# Patient Record
Sex: Female | Born: 1935 | Race: Black or African American | Hispanic: No | Smoking: Never smoker
Health system: Southern US, Community
[De-identification: ages and names within clinical notes are randomized; demographics above are authoritative.]

## PROBLEM LIST (undated history)

## (undated) DIAGNOSIS — M722 Plantar fascial fibromatosis: Secondary | ICD-10-CM

## (undated) DIAGNOSIS — I10 Essential (primary) hypertension: Secondary | ICD-10-CM

## (undated) DIAGNOSIS — Z789 Other specified health status: Secondary | ICD-10-CM

## (undated) DIAGNOSIS — E7800 Pure hypercholesterolemia, unspecified: Secondary | ICD-10-CM

## (undated) DIAGNOSIS — E119 Type 2 diabetes mellitus without complications: Secondary | ICD-10-CM

## (undated) DIAGNOSIS — E785 Hyperlipidemia, unspecified: Secondary | ICD-10-CM

## (undated) DIAGNOSIS — M109 Gout, unspecified: Secondary | ICD-10-CM

## (undated) DIAGNOSIS — E559 Vitamin D deficiency, unspecified: Secondary | ICD-10-CM

## (undated) DIAGNOSIS — J309 Allergic rhinitis, unspecified: Secondary | ICD-10-CM

## (undated) DIAGNOSIS — R011 Cardiac murmur, unspecified: Secondary | ICD-10-CM

## (undated) DIAGNOSIS — N189 Chronic kidney disease, unspecified: Secondary | ICD-10-CM

## (undated) DIAGNOSIS — M1990 Unspecified osteoarthritis, unspecified site: Secondary | ICD-10-CM

## (undated) DIAGNOSIS — M8580 Other specified disorders of bone density and structure, unspecified site: Secondary | ICD-10-CM

## (undated) HISTORY — DX: Unspecified osteoarthritis, unspecified site: M19.90

## (undated) HISTORY — DX: Pure hypercholesterolemia, unspecified: E78.00

## (undated) HISTORY — DX: Chronic kidney disease, unspecified: N18.9

## (undated) HISTORY — DX: Hyperlipidemia, unspecified: E78.5

## (undated) HISTORY — PX: WISDOM TOOTH EXTRACTION: SHX21

## (undated) HISTORY — DX: Allergic rhinitis, unspecified: J30.9

## (undated) HISTORY — PX: COLONOSCOPY: SHX174

## (undated) HISTORY — DX: Vitamin D deficiency, unspecified: E55.9

## (undated) HISTORY — DX: Type 2 diabetes mellitus without complications: E11.9

## (undated) HISTORY — DX: Gout, unspecified: M10.9

## (undated) HISTORY — DX: Essential (primary) hypertension: I10

## (undated) HISTORY — DX: Plantar fascial fibromatosis: M72.2

## (undated) HISTORY — DX: Other specified disorders of bone density and structure, unspecified site: M85.80

---

## 1999-06-21 ENCOUNTER — Encounter: Admission: RE | Admit: 1999-06-21 | Discharge: 1999-06-21 | Payer: Self-pay | Admitting: Family Medicine

## 1999-06-21 ENCOUNTER — Encounter: Payer: Self-pay | Admitting: Family Medicine

## 1999-07-13 ENCOUNTER — Encounter: Payer: Self-pay | Admitting: Family Medicine

## 1999-07-13 ENCOUNTER — Encounter: Admission: RE | Admit: 1999-07-13 | Discharge: 1999-07-13 | Payer: Self-pay | Admitting: Family Medicine

## 1999-08-23 ENCOUNTER — Emergency Department (HOSPITAL_COMMUNITY): Admission: EM | Admit: 1999-08-23 | Discharge: 1999-08-23 | Payer: Self-pay | Admitting: Emergency Medicine

## 1999-08-23 ENCOUNTER — Encounter: Payer: Self-pay | Admitting: Emergency Medicine

## 2000-06-26 ENCOUNTER — Encounter: Payer: Self-pay | Admitting: Family Medicine

## 2000-06-26 ENCOUNTER — Encounter: Admission: RE | Admit: 2000-06-26 | Discharge: 2000-06-26 | Payer: Self-pay | Admitting: Family Medicine

## 2001-06-28 ENCOUNTER — Encounter: Payer: Self-pay | Admitting: Family Medicine

## 2001-06-28 ENCOUNTER — Encounter: Admission: RE | Admit: 2001-06-28 | Discharge: 2001-06-28 | Payer: Self-pay | Admitting: Family Medicine

## 2002-06-26 ENCOUNTER — Encounter: Admission: RE | Admit: 2002-06-26 | Discharge: 2002-06-26 | Payer: Self-pay | Admitting: Family Medicine

## 2002-06-26 ENCOUNTER — Encounter: Payer: Self-pay | Admitting: Family Medicine

## 2003-04-15 ENCOUNTER — Other Ambulatory Visit: Admission: RE | Admit: 2003-04-15 | Discharge: 2003-04-15 | Payer: Self-pay | Admitting: Family Medicine

## 2003-08-19 ENCOUNTER — Encounter: Admission: RE | Admit: 2003-08-19 | Discharge: 2003-08-19 | Payer: Self-pay | Admitting: Family Medicine

## 2004-08-26 ENCOUNTER — Encounter: Admission: RE | Admit: 2004-08-26 | Discharge: 2004-08-26 | Payer: Self-pay | Admitting: Family Medicine

## 2004-09-03 ENCOUNTER — Encounter: Admission: RE | Admit: 2004-09-03 | Discharge: 2004-09-03 | Payer: Self-pay | Admitting: Family Medicine

## 2005-02-15 ENCOUNTER — Encounter: Admission: RE | Admit: 2005-02-15 | Discharge: 2005-02-15 | Payer: Self-pay | Admitting: Family Medicine

## 2005-09-19 ENCOUNTER — Encounter: Admission: RE | Admit: 2005-09-19 | Discharge: 2005-09-19 | Payer: Self-pay | Admitting: Family Medicine

## 2006-09-22 ENCOUNTER — Encounter: Admission: RE | Admit: 2006-09-22 | Discharge: 2006-09-22 | Payer: Self-pay | Admitting: Family Medicine

## 2007-03-07 ENCOUNTER — Encounter: Admission: RE | Admit: 2007-03-07 | Discharge: 2007-03-07 | Payer: Self-pay | Admitting: Interventional Cardiology

## 2007-09-25 ENCOUNTER — Encounter: Admission: RE | Admit: 2007-09-25 | Discharge: 2007-09-25 | Payer: Self-pay | Admitting: Family Medicine

## 2008-09-25 ENCOUNTER — Encounter: Admission: RE | Admit: 2008-09-25 | Discharge: 2008-09-25 | Payer: Self-pay | Admitting: Family Medicine

## 2009-10-20 ENCOUNTER — Encounter: Admission: RE | Admit: 2009-10-20 | Discharge: 2009-10-20 | Payer: Self-pay | Admitting: Family Medicine

## 2010-02-28 ENCOUNTER — Encounter: Payer: Self-pay | Admitting: Family Medicine

## 2010-10-07 ENCOUNTER — Other Ambulatory Visit: Payer: Self-pay | Admitting: Family Medicine

## 2010-10-07 DIAGNOSIS — Z1231 Encounter for screening mammogram for malignant neoplasm of breast: Secondary | ICD-10-CM

## 2010-10-22 ENCOUNTER — Ambulatory Visit: Payer: Self-pay

## 2010-11-12 ENCOUNTER — Ambulatory Visit
Admission: RE | Admit: 2010-11-12 | Discharge: 2010-11-12 | Disposition: A | Discharge status: Home / Self Care | Payer: Medicare Other | Source: Ambulatory Visit | Attending: Family Medicine | Admitting: Family Medicine

## 2010-11-12 DIAGNOSIS — Z1231 Encounter for screening mammogram for malignant neoplasm of breast: Secondary | ICD-10-CM

## 2010-11-17 ENCOUNTER — Other Ambulatory Visit: Payer: Self-pay | Admitting: Family Medicine

## 2010-11-17 DIAGNOSIS — R928 Other abnormal and inconclusive findings on diagnostic imaging of breast: Secondary | ICD-10-CM

## 2010-12-08 ENCOUNTER — Ambulatory Visit
Admission: RE | Admit: 2010-12-08 | Discharge: 2010-12-08 | Disposition: A | Discharge status: Home / Self Care | Payer: Medicare Other | Source: Ambulatory Visit | Attending: Family Medicine | Admitting: Family Medicine

## 2010-12-08 DIAGNOSIS — R928 Other abnormal and inconclusive findings on diagnostic imaging of breast: Secondary | ICD-10-CM

## 2011-11-10 ENCOUNTER — Other Ambulatory Visit: Payer: Self-pay | Admitting: Family Medicine

## 2011-11-10 DIAGNOSIS — Z1231 Encounter for screening mammogram for malignant neoplasm of breast: Secondary | ICD-10-CM

## 2011-12-08 ENCOUNTER — Ambulatory Visit
Admission: RE | Admit: 2011-12-08 | Discharge: 2011-12-08 | Disposition: A | Discharge status: Home / Self Care | Payer: Medicare Other | Source: Ambulatory Visit | Attending: Family Medicine | Admitting: Family Medicine

## 2011-12-08 DIAGNOSIS — Z1231 Encounter for screening mammogram for malignant neoplasm of breast: Secondary | ICD-10-CM

## 2011-12-09 ENCOUNTER — Other Ambulatory Visit: Payer: Self-pay | Admitting: Family Medicine

## 2011-12-09 DIAGNOSIS — R928 Other abnormal and inconclusive findings on diagnostic imaging of breast: Secondary | ICD-10-CM

## 2011-12-27 ENCOUNTER — Ambulatory Visit
Admission: RE | Admit: 2011-12-27 | Discharge: 2011-12-27 | Disposition: A | Discharge status: Home / Self Care | Payer: Medicare Other | Source: Ambulatory Visit | Attending: Family Medicine | Admitting: Family Medicine

## 2011-12-27 DIAGNOSIS — R928 Other abnormal and inconclusive findings on diagnostic imaging of breast: Secondary | ICD-10-CM

## 2012-11-20 ENCOUNTER — Other Ambulatory Visit: Payer: Self-pay

## 2012-11-20 DIAGNOSIS — Z1231 Encounter for screening mammogram for malignant neoplasm of breast: Secondary | ICD-10-CM

## 2012-12-12 ENCOUNTER — Ambulatory Visit
Admission: RE | Admit: 2012-12-12 | Discharge: 2012-12-12 | Disposition: A | Discharge status: Home / Self Care | Payer: Medicare Other | Source: Ambulatory Visit

## 2012-12-12 DIAGNOSIS — Z1231 Encounter for screening mammogram for malignant neoplasm of breast: Secondary | ICD-10-CM

## 2013-01-22 ENCOUNTER — Ambulatory Visit: Payer: Medicare Other | Admitting: Interventional Cardiology

## 2013-01-25 ENCOUNTER — Ambulatory Visit: Payer: Medicare Other | Admitting: Interventional Cardiology

## 2013-02-20 ENCOUNTER — Telehealth: Payer: Self-pay

## 2013-02-20 MED ORDER — VALSARTAN-HYDROCHLOROTHIAZIDE 160-12.5 MG PO TABS: 1.0000 | ORAL_TABLET | Freq: Two times a day (BID) | ORAL | Status: DC

## 2013-02-20 NOTE — Telephone Encounter (Signed)
Refilled

## 2013-03-04 ENCOUNTER — Encounter: Payer: Self-pay | Admitting: *Deleted

## 2013-03-04 ENCOUNTER — Encounter: Payer: Self-pay | Admitting: Interventional Cardiology

## 2013-03-04 DIAGNOSIS — E1169 Type 2 diabetes mellitus with other specified complication: Secondary | ICD-10-CM | POA: Insufficient documentation

## 2013-03-04 DIAGNOSIS — E7800 Pure hypercholesterolemia, unspecified: Secondary | ICD-10-CM | POA: Insufficient documentation

## 2013-03-04 DIAGNOSIS — I10 Essential (primary) hypertension: Secondary | ICD-10-CM | POA: Insufficient documentation

## 2013-03-04 DIAGNOSIS — J309 Allergic rhinitis, unspecified: Secondary | ICD-10-CM | POA: Insufficient documentation

## 2013-03-04 DIAGNOSIS — E119 Type 2 diabetes mellitus without complications: Secondary | ICD-10-CM | POA: Insufficient documentation

## 2013-03-04 DIAGNOSIS — M1990 Unspecified osteoarthritis, unspecified site: Secondary | ICD-10-CM | POA: Insufficient documentation

## 2013-03-04 DIAGNOSIS — E785 Hyperlipidemia, unspecified: Secondary | ICD-10-CM | POA: Insufficient documentation

## 2013-03-04 DIAGNOSIS — E559 Vitamin D deficiency, unspecified: Secondary | ICD-10-CM | POA: Insufficient documentation

## 2013-03-04 DIAGNOSIS — M109 Gout, unspecified: Secondary | ICD-10-CM | POA: Insufficient documentation

## 2013-03-04 DIAGNOSIS — M8580 Other specified disorders of bone density and structure, unspecified site: Secondary | ICD-10-CM | POA: Insufficient documentation

## 2013-03-04 DIAGNOSIS — M722 Plantar fascial fibromatosis: Secondary | ICD-10-CM | POA: Insufficient documentation

## 2013-03-04 DIAGNOSIS — N189 Chronic kidney disease, unspecified: Secondary | ICD-10-CM | POA: Insufficient documentation

## 2013-03-05 ENCOUNTER — Other Ambulatory Visit: Payer: Self-pay | Admitting: Cardiology

## 2013-03-05 MED ORDER — DOXAZOSIN MESYLATE 2 MG PO TABS: 2.0000 mg | ORAL_TABLET | Freq: Every day | ORAL | Status: DC

## 2013-03-11 ENCOUNTER — Encounter: Payer: Self-pay | Admitting: Interventional Cardiology

## 2013-03-11 ENCOUNTER — Ambulatory Visit (INDEPENDENT_AMBULATORY_CARE_PROVIDER_SITE_OTHER): Payer: Medicare Other | Admitting: Interventional Cardiology

## 2013-03-11 VITALS — BP 170/70 | HR 64 | Ht 61.0 in | Wt 133.1 lb

## 2013-03-11 DIAGNOSIS — I10 Essential (primary) hypertension: Secondary | ICD-10-CM

## 2013-03-11 MED ORDER — VALSARTAN-HYDROCHLOROTHIAZIDE 320-25 MG PO TABS: 1.0000 | ORAL_TABLET | Freq: Every day | ORAL | Status: DC

## 2013-03-11 NOTE — Patient Instructions (Signed)
Your physician wants you to follow-up in: 1 year with Dr. Eldridge DaceVaranasi. You will receive a reminder letter in the mail two months in advance. If you don't receive a letter, please call our office to schedule the follow-up appointment.  Your physician has recommended you make the following change in your medication:   1. Stop Valsaratn/hctz 160-12.5 mg.  2. Start Valsaratn/hctx 320-25 mg 1 tbalet by mouth daily.   Continue all other medications.

## 2013-03-11 NOTE — Progress Notes (Signed)
Patient ID: Valerie Bonilla, female   DOB: Apr 10, 1934, 79 y.o.   MRN: [id number]    [address] [place], [place]  [address] Phone: [phone]) [phone] Fax:  [phone]  Date:  03/11/2013   ID:  Valerie LessenFannie B Bonilla, DOB Apr 10, 1934, MRN [id number]  PCP:  No primary provider on file.      History of Present Illness: Valerie Bonilla is a 79 y.o. female who has had difficult to control BP. SHe had BPs in the 120-140s range systolic. No readings above 150 systolic at home. More recently at home, it has been in the 140s systolic. She did not qualify for the renal denervation trial. Hypertension:  Denies : Chest pain.  Dizziness.  Leg edema with arthritis.  Palpitations.  Dyspnea.  Cough.  Blurry vision.  Syncope.     Wt Readings from Last 3 Encounters:  03/11/13 133 lb 1.9 oz (60.383 kg)     Past Medical History  Diagnosis Date  . Hypercholesteremia   . HTN (hypertension)   . Diabetes   . Vitamin D deficiency   . Allergic rhinitis   . Osteoarthritis   . Hyperlipidemia   . Plantar fasciitis   . Gout   . Osteopenia   . CKD (chronic kidney disease)     Current Outpatient Prescriptions  Medication Sig Dispense Refill  . amLODipine (NORVASC) 10 MG tablet Take 1 tablet by mouth daily.      . cloNIDine (CATAPRES) 0.2 MG tablet Take 1 tablet by mouth 2 (two) times daily.      Marland Kitchen. doxazosin (CARDURA) 2 MG tablet Take 1 tablet (2 mg total) by mouth daily.  30 tablet  0  . fluorometholone (FML) 0.1 % ophthalmic suspension       . glimepiride (AMARYL) 2 MG tablet       . metFORMIN (GLUCOPHAGE) 500 MG tablet Take 1 tablet by mouth 2 (two) times daily.      . metoprolol succinate (TOPROL-XL) 100 MG 24 hr tablet 1 1/2 tab daily      . RESTASIS 0.05 % ophthalmic emulsion As directed      . simvastatin (ZOCOR) 20 MG tablet Take 1 tablet by mouth daily.      Marland Kitchen. ULORIC 40 MG tablet Take 1 tablet by mouth daily.      . valsartan-hydrochlorothiazide (DIOVAN-HCT) 160-12.5 MG per tablet  Take 1 tablet by mouth 2 (two) times daily.  60 tablet  1   No current facility-administered medications for this visit.    Allergies:    Allergies  Allergen Reactions  . Ace Inhibitors     Social History:  The patient  reports that she has never smoked. She does not have any smokeless tobacco history on file.   Family History:  The patient's family history is not on file.   ROS:  Please see the history of present illness.  No nausea, vomiting.  No fevers, chills.  No focal weakness.  No dysuria.   All other systems reviewed and negative.   PHYSICAL EXAM: VS:  BP 170/70  Pulse 64  Ht 5\' 1"  (1.549 m)  Wt 133 lb 1.9 oz (60.383 kg)  BMI 25.17 kg/m2 Well nourished, well developed, in no acute distress HEENT: normal Neck: no JVD, no carotid bruits Cardiac:  normal S1, S2; RRR;  Lungs:  clear to auscultation bilaterally, no wheezing, rhonchi or rales Abd: soft, nontender, no hepatomegaly Ext: no edema Skin: warm and dry Neuro:   no focal  abnormalities noted  EKG:  Normal  11/14: LDL 71  ASSESSMENT AND PLAN:  Essential hypertension, benign  Continue Diovan HCT Tablet, 320/25 MG, TAKE 1 TABLET onCE DAILY; insurance will not pay for 60 day supply of the 160/12.5 Continue Metoprolol Succinate Tablet Extended Release 24 Hour, 100 MG, 1 1/2 tablet, Orally, Once a day Continue Clonidine HCl Tablet, 0.2 MG, 1 tablet, Twice a day Continue Amlodipine Besylate Tablet, 10 MG, 1 tablet, Orally, Once a day Continue Cardura Tablet, 2 MG, 1 tablet, Orally, Once a day, 30 day(s), 30, Refills 11 Diagnostic Imaging:EKG Harward,Amy 01/24/2012 02:22:55 PM > VARANASI,JAY 01/24/2012 02:45:00 PM > NSR, no significant ST segment changes  Did not qualify for [facility] study of renal denervation therapy. BP has been considerably better over the past 12 months. Continue to check BP at home. Try to avoid canned foods. SHe will use more frozen vegetables. She is eating out much less and I think the decreased  sodium intake is helping. Highest readings are in the doctor's ofice.   Controlled at home.    LVH:  Hyperdynamic LV function by echo in 2012. Mild aortic sclerosis explains the murmur.  Preventive Medicine  Adult topics discussed:  Diet: low salt.  Exercise: at least 30 minutes of aerobic exercise, 5 days a week.      Signed, Fredric Mare, MD, [facility] 03/11/2013 4:14 PM

## 2013-04-08 ENCOUNTER — Other Ambulatory Visit: Payer: Self-pay | Admitting: Interventional Cardiology

## 2013-05-01 ENCOUNTER — Other Ambulatory Visit: Payer: Self-pay | Admitting: *Deleted

## 2013-05-01 MED ORDER — AMLODIPINE BESYLATE 10 MG PO TABS: 10.0000 mg | ORAL_TABLET | Freq: Every day | ORAL | Status: DC

## 2013-10-04 ENCOUNTER — Other Ambulatory Visit: Payer: Self-pay

## 2013-10-04 MED ORDER — CLONIDINE HCL 0.2 MG PO TABS: 0.2000 mg | ORAL_TABLET | Freq: Two times a day (BID) | ORAL | Status: AC

## 2013-10-30 ENCOUNTER — Other Ambulatory Visit: Payer: Self-pay

## 2013-10-30 MED ORDER — AMLODIPINE BESYLATE 10 MG PO TABS: 10.0000 mg | ORAL_TABLET | Freq: Every day | ORAL | Status: DC

## 2013-11-12 ENCOUNTER — Other Ambulatory Visit: Payer: Self-pay | Admitting: Interventional Cardiology

## 2013-11-12 ENCOUNTER — Other Ambulatory Visit: Payer: Self-pay

## 2013-11-12 DIAGNOSIS — Z1239 Encounter for other screening for malignant neoplasm of breast: Secondary | ICD-10-CM

## 2013-12-17 ENCOUNTER — Encounter (INDEPENDENT_AMBULATORY_CARE_PROVIDER_SITE_OTHER): Payer: Self-pay

## 2013-12-17 ENCOUNTER — Ambulatory Visit
Admission: RE | Admit: 2013-12-17 | Discharge: 2013-12-17 | Disposition: A | Discharge status: Home / Self Care | Payer: Medicare Other | Source: Ambulatory Visit

## 2013-12-17 DIAGNOSIS — Z1239 Encounter for other screening for malignant neoplasm of breast: Secondary | ICD-10-CM

## 2014-02-24 ENCOUNTER — Other Ambulatory Visit: Payer: Self-pay | Admitting: Interventional Cardiology

## 2014-03-11 ENCOUNTER — Telehealth: Payer: Self-pay | Admitting: *Deleted

## 2014-03-11 NOTE — Telephone Encounter (Signed)
Documents received from [employer]. Spoke with a representative in regards to what alternative there was for  Valsartan/HCTZ 320-25 that would be covered. Representative stated that there were two options: 1) Irbesartan 300mg  and HCTZ 25mg  2) Telmisartan/HCTZ 80mg /25mg  Will forward this information to Dr. Eldridge DaceVaranasi for review and advisement.

## 2014-03-12 NOTE — Telephone Encounter (Signed)
OK to switch to irbesartan 300 mg daily and HCTZ 25 mg daily

## 2014-03-12 NOTE — Telephone Encounter (Signed)
LMTCB

## 2014-03-13 NOTE — Telephone Encounter (Signed)
LMTCB

## 2014-03-17 NOTE — Telephone Encounter (Signed)
Spoke with pt in regards to medication changes. Pt states that she picked up prescription for Valsartan/HCTZ 320/25 last week and that insurance is covering the medication. Pt states that she would like to stay on this medication. Informed pt that I would send information over to Dr. Eldridge DaceVaranasi for approval. Pt verbalized understanding and was in agreement with this plan.

## 2014-03-17 NOTE — Telephone Encounter (Signed)
If insurance covering, ok to stay on medicine,

## 2014-03-18 NOTE — Telephone Encounter (Signed)
Informed pt that Dr. Eldridge DaceVaranasi said it was ok for her to stay on the Valsartan/HCTZ if the insurance was covering. Informed pt that if she had any issues getting it refilled the next time to give our office a call. Pt verbalized understanding and was in agreement with this plan.

## 2014-03-19 ENCOUNTER — Ambulatory Visit: Payer: Medicare Other | Admitting: Interventional Cardiology

## 2014-03-27 ENCOUNTER — Encounter: Payer: Self-pay | Admitting: Interventional Cardiology

## 2014-03-27 ENCOUNTER — Ambulatory Visit (INDEPENDENT_AMBULATORY_CARE_PROVIDER_SITE_OTHER): Payer: Medicare Other | Admitting: Interventional Cardiology

## 2014-03-27 VITALS — BP 176/86 | HR 65 | Ht 61.0 in | Wt 131.1 lb

## 2014-03-27 DIAGNOSIS — I10 Essential (primary) hypertension: Secondary | ICD-10-CM

## 2014-03-27 NOTE — Patient Instructions (Signed)
Your physician recommends that you continue on your current medications as directed. Please refer to the Current Medication list given to you today. Your physician recommends that you schedule a follow-up appointment as needed with Dr. Varanasi.   

## 2014-03-27 NOTE — Progress Notes (Signed)
Patient ID: Valerie Bonilla, female   DOB: 06-19-1934, 80 y.o.   MRN: [id number] Patient ID: Valerie Bonilla, female   DOB: [birth year], 80 y.o.   MRN: [id number]    [address] [place], [place]  [address] Phone: [phone] Fax:  [phone]  Date:  03/27/2014   ID:  Valerie Bonilla, DOB 09-Nov-1934, MRN [id number]  PCP:  Astrid Divine, MD      History of Present Illness: Valerie Bonilla is a 80 y.o. female who has had difficult to control BP. SHe had BPs in the 120-140s range systolic. No readings above 150 systolic at home. More recently at home, it has been in the 140s systolic. She did not qualify for the renal denervation trial at [facility]. Hypertension:  Denies : Chest pain.  Dizziness.  Leg edema with arthritis.  Palpitations.  Dyspnea.  Cough.  Blurry vision.  Syncope.   BP at PMD office in 1/16 revealed BP of 118 systolic  Wt Readings from Last 3 Encounters:  03/27/14 131 lb 1.9 oz (59.476 kg)  03/11/13 133 lb 1.9 oz (60.383 kg)     Past Medical History  Diagnosis Date  . Hypercholesteremia   . HTN (hypertension)   . Diabetes   . Vitamin D deficiency   . Allergic rhinitis   . Osteoarthritis   . Hyperlipidemia   . Plantar fasciitis   . Gout   . Osteopenia   . CKD (chronic kidney disease)     Current Outpatient Prescriptions  Medication Sig Dispense Refill  . amLODipine (NORVASC) 10 MG tablet Take 1 tablet (10 mg total) by mouth daily. 90 tablet 2  . cloNIDine (CATAPRES) 0.2 MG tablet Take 1 tablet (0.2 mg total) by mouth 2 (two) times daily. 180 tablet 3  . doxazosin (CARDURA) 2 MG tablet TAKE 1 TABLET BY MOUTH DAILY. 30 tablet 0  . fluorometholone (FML) 0.1 % ophthalmic suspension Place 1 drop into both eyes daily.     Marland Kitchen glimepiride (AMARYL) 2 MG tablet Take 2 mg by mouth daily with breakfast.     . metFORMIN (GLUCOPHAGE) 500 MG tablet Take 1 tablet by mouth 2 (two) times daily.    . metoprolol succinate (TOPROL-XL) 100 MG 24 hr tablet  Take 100 mg by mouth daily. 1 1/2 tab daily    . RESTASIS 0.05 % ophthalmic emulsion Place 1 drop into both eyes daily. As directed    . simvastatin (ZOCOR) 20 MG tablet Take 1 tablet by mouth daily.    Marland Kitchen ULORIC 40 MG tablet Take 1 tablet by mouth daily.    . valsartan-hydrochlorothiazide (DIOVAN-HCT) 320-25 MG per tablet Take 1 tablet by mouth daily. 30 tablet 11   No current facility-administered medications for this visit.    Allergies:    Allergies  Allergen Reactions  . Ace Inhibitors     Social History:  The patient  reports that she has never smoked. She does not have any smokeless tobacco history on file.   Family History:  The patient's family history is not on file.   ROS:  Please see the history of present illness.  No nausea, vomiting.  No fevers, chills.  No focal weakness.  No dysuria.   All other systems reviewed and negative.   PHYSICAL EXAM: VS:  BP 176/86 mmHg  Pulse 65  Ht  (1.549 m)  Wt 131 lb 1.9 oz (59.476 kg)  BMI 24.79 kg/m2 Well nourished, well developed, in no acute distress  HEENT: normal Neck: no JVD, no carotid bruits Cardiac:  normal S1, S2; RRR;  Lungs:  clear to auscultation bilaterally, no wheezing, rhonchi or rales Abd: soft, nontender, no hepatomegaly Ext: no edema Skin: warm and dry Neuro:   no focal abnormalities noted Psych normal affect  EKG:  Normal sinus rhythm, LAE, no ST segment changes; 11/14: LDL 71  ASSESSMENT AND PLAN:  Essential hypertension, benign  Continue Diovan HCT Tablet, 320/25 MG, TAKE 1 TABLET onCE DAILY; insurance will not pay for 60 day supply of the 160/12.5 Continue Metoprolol Succinate Tablet Extended Release 24 Hour, 100 MG, 1 1/2 tablet, Orally, Once a day Continue Clonidine HCl Tablet, 0.2 MG, 1 tablet, Twice a day Continue Amlodipine Besylate Tablet, 10 MG, 1 tablet, Orally, Once a day Continue Cardura Tablet, 2 MG, 1 tablet, Orally, Once a day, 30 day(s), 30, Refills 11  . BP has been consistentl  well controlled over the past 12 months at home. Continue to check BP at home. Continue to avoid canned foods. SHe will use more frozen vegetables. She is eating out much less and I think the decreased sodium intake is helping. Highest readings are in the doctor's office. Recheck today 176/68.    Controlled at home as recently as a few days ago.   Readings reviewed.  BP controlled at PMD.  Will have her f/u prn. Increase exercise to 150 min/week   LVH:  Hyperdynamic LV function by echo in 2012. Mild aortic sclerosis explains the murmur.  Preventive Medicine  Adult topics discussed:  Diet: low salt.  Exercise: at least 30 minutes of aerobic exercise, 5 days a week.      Signed, Fredric MareJay S. Knute Mazzuca, MD, [facility] 03/27/2014 3:47 PM

## 2014-04-19 ENCOUNTER — Other Ambulatory Visit: Payer: Self-pay | Admitting: Interventional Cardiology

## 2014-11-12 ENCOUNTER — Other Ambulatory Visit: Payer: Self-pay

## 2014-11-12 DIAGNOSIS — Z1231 Encounter for screening mammogram for malignant neoplasm of breast: Secondary | ICD-10-CM

## 2014-12-22 ENCOUNTER — Ambulatory Visit
Admission: RE | Admit: 2014-12-22 | Discharge: 2014-12-22 | Disposition: A | Discharge status: Home / Self Care | Payer: Medicare Other | Source: Ambulatory Visit

## 2014-12-22 DIAGNOSIS — Z1231 Encounter for screening mammogram for malignant neoplasm of breast: Secondary | ICD-10-CM

## 2015-04-01 DIAGNOSIS — M8589 Other specified disorders of bone density and structure, multiple sites: Secondary | ICD-10-CM | POA: Diagnosis not present

## 2015-04-01 DIAGNOSIS — M859 Disorder of bone density and structure, unspecified: Secondary | ICD-10-CM | POA: Diagnosis not present

## 2015-08-25 DIAGNOSIS — N183 Chronic kidney disease, stage 3 (moderate): Secondary | ICD-10-CM | POA: Diagnosis not present

## 2015-08-25 DIAGNOSIS — I129 Hypertensive chronic kidney disease with stage 1 through stage 4 chronic kidney disease, or unspecified chronic kidney disease: Secondary | ICD-10-CM | POA: Diagnosis not present

## 2015-08-25 DIAGNOSIS — E7800 Pure hypercholesterolemia, unspecified: Secondary | ICD-10-CM | POA: Diagnosis not present

## 2015-08-25 DIAGNOSIS — M109 Gout, unspecified: Secondary | ICD-10-CM | POA: Diagnosis not present

## 2015-08-25 DIAGNOSIS — E1121 Type 2 diabetes mellitus with diabetic nephropathy: Secondary | ICD-10-CM | POA: Diagnosis not present

## 2015-11-18 ENCOUNTER — Other Ambulatory Visit: Payer: Self-pay | Admitting: Family Medicine

## 2015-11-18 DIAGNOSIS — Z1231 Encounter for screening mammogram for malignant neoplasm of breast: Secondary | ICD-10-CM

## 2015-11-20 DIAGNOSIS — H40033 Anatomical narrow angle, bilateral: Secondary | ICD-10-CM | POA: Diagnosis not present

## 2015-11-20 DIAGNOSIS — E119 Type 2 diabetes mellitus without complications: Secondary | ICD-10-CM | POA: Diagnosis not present

## 2015-12-28 ENCOUNTER — Ambulatory Visit
Admission: RE | Admit: 2015-12-28 | Discharge: 2015-12-28 | Disposition: A | Discharge status: Home / Self Care | Payer: Medicare Other | Source: Ambulatory Visit | Attending: Family Medicine | Admitting: Family Medicine

## 2015-12-28 DIAGNOSIS — Z1231 Encounter for screening mammogram for malignant neoplasm of breast: Secondary | ICD-10-CM | POA: Diagnosis not present

## 2016-03-02 DIAGNOSIS — E1121 Type 2 diabetes mellitus with diabetic nephropathy: Secondary | ICD-10-CM | POA: Diagnosis not present

## 2016-03-02 DIAGNOSIS — E559 Vitamin D deficiency, unspecified: Secondary | ICD-10-CM | POA: Diagnosis not present

## 2016-03-02 DIAGNOSIS — E7800 Pure hypercholesterolemia, unspecified: Secondary | ICD-10-CM | POA: Diagnosis not present

## 2016-03-02 DIAGNOSIS — Z0000 Encounter for general adult medical examination without abnormal findings: Secondary | ICD-10-CM | POA: Diagnosis not present

## 2016-03-02 DIAGNOSIS — J309 Allergic rhinitis, unspecified: Secondary | ICD-10-CM | POA: Diagnosis not present

## 2016-03-02 DIAGNOSIS — M109 Gout, unspecified: Secondary | ICD-10-CM | POA: Diagnosis not present

## 2016-03-02 DIAGNOSIS — I129 Hypertensive chronic kidney disease with stage 1 through stage 4 chronic kidney disease, or unspecified chronic kidney disease: Secondary | ICD-10-CM | POA: Diagnosis not present

## 2016-03-02 DIAGNOSIS — M859 Disorder of bone density and structure, unspecified: Secondary | ICD-10-CM | POA: Diagnosis not present

## 2016-03-02 DIAGNOSIS — M150 Primary generalized (osteo)arthritis: Secondary | ICD-10-CM | POA: Diagnosis not present

## 2016-03-02 DIAGNOSIS — N183 Chronic kidney disease, stage 3 (moderate): Secondary | ICD-10-CM | POA: Diagnosis not present

## 2016-10-03 DIAGNOSIS — E1121 Type 2 diabetes mellitus with diabetic nephropathy: Secondary | ICD-10-CM | POA: Diagnosis not present

## 2016-10-03 DIAGNOSIS — N183 Chronic kidney disease, stage 3 (moderate): Secondary | ICD-10-CM | POA: Diagnosis not present

## 2016-10-03 DIAGNOSIS — I129 Hypertensive chronic kidney disease with stage 1 through stage 4 chronic kidney disease, or unspecified chronic kidney disease: Secondary | ICD-10-CM | POA: Diagnosis not present

## 2016-10-03 DIAGNOSIS — E7800 Pure hypercholesterolemia, unspecified: Secondary | ICD-10-CM | POA: Diagnosis not present

## 2016-10-31 DIAGNOSIS — Z23 Encounter for immunization: Secondary | ICD-10-CM | POA: Diagnosis not present

## 2016-11-24 ENCOUNTER — Other Ambulatory Visit: Payer: Self-pay | Admitting: Family Medicine

## 2016-11-24 DIAGNOSIS — Z1231 Encounter for screening mammogram for malignant neoplasm of breast: Secondary | ICD-10-CM

## 2016-12-28 ENCOUNTER — Ambulatory Visit
Admission: RE | Admit: 2016-12-28 | Discharge: 2016-12-28 | Disposition: A | Discharge status: Home / Self Care | Payer: Medicare Other | Source: Ambulatory Visit | Attending: Family Medicine | Admitting: Family Medicine

## 2016-12-28 DIAGNOSIS — Z1231 Encounter for screening mammogram for malignant neoplasm of breast: Secondary | ICD-10-CM

## 2017-01-02 ENCOUNTER — Other Ambulatory Visit: Payer: Self-pay | Admitting: Family Medicine

## 2017-01-02 DIAGNOSIS — R928 Other abnormal and inconclusive findings on diagnostic imaging of breast: Secondary | ICD-10-CM

## 2017-01-06 ENCOUNTER — Ambulatory Visit
Admission: RE | Admit: 2017-01-06 | Discharge: 2017-01-06 | Disposition: A | Discharge status: Home / Self Care | Payer: Medicare Other | Source: Ambulatory Visit | Attending: Family Medicine | Admitting: Family Medicine

## 2017-01-06 DIAGNOSIS — N6001 Solitary cyst of right breast: Secondary | ICD-10-CM | POA: Diagnosis not present

## 2017-01-06 DIAGNOSIS — R928 Other abnormal and inconclusive findings on diagnostic imaging of breast: Secondary | ICD-10-CM

## 2017-03-10 DIAGNOSIS — N183 Chronic kidney disease, stage 3 (moderate): Secondary | ICD-10-CM | POA: Diagnosis not present

## 2017-03-10 DIAGNOSIS — E1121 Type 2 diabetes mellitus with diabetic nephropathy: Secondary | ICD-10-CM | POA: Diagnosis not present

## 2017-03-10 DIAGNOSIS — Z0000 Encounter for general adult medical examination without abnormal findings: Secondary | ICD-10-CM | POA: Diagnosis not present

## 2017-03-10 DIAGNOSIS — I129 Hypertensive chronic kidney disease with stage 1 through stage 4 chronic kidney disease, or unspecified chronic kidney disease: Secondary | ICD-10-CM | POA: Diagnosis not present

## 2017-07-07 DIAGNOSIS — L509 Urticaria, unspecified: Secondary | ICD-10-CM | POA: Diagnosis not present

## 2017-07-26 DIAGNOSIS — I129 Hypertensive chronic kidney disease with stage 1 through stage 4 chronic kidney disease, or unspecified chronic kidney disease: Secondary | ICD-10-CM | POA: Diagnosis not present

## 2017-07-26 DIAGNOSIS — L509 Urticaria, unspecified: Secondary | ICD-10-CM | POA: Diagnosis not present

## 2017-09-07 DIAGNOSIS — E7800 Pure hypercholesterolemia, unspecified: Secondary | ICD-10-CM | POA: Diagnosis not present

## 2017-09-07 DIAGNOSIS — I129 Hypertensive chronic kidney disease with stage 1 through stage 4 chronic kidney disease, or unspecified chronic kidney disease: Secondary | ICD-10-CM | POA: Diagnosis not present

## 2017-09-07 DIAGNOSIS — E1121 Type 2 diabetes mellitus with diabetic nephropathy: Secondary | ICD-10-CM | POA: Diagnosis not present

## 2017-09-07 DIAGNOSIS — N183 Chronic kidney disease, stage 3 (moderate): Secondary | ICD-10-CM | POA: Diagnosis not present

## 2017-11-28 ENCOUNTER — Other Ambulatory Visit: Payer: Self-pay | Admitting: Family Medicine

## 2017-11-28 DIAGNOSIS — Z1231 Encounter for screening mammogram for malignant neoplasm of breast: Secondary | ICD-10-CM

## 2017-12-11 DIAGNOSIS — E1121 Type 2 diabetes mellitus with diabetic nephropathy: Secondary | ICD-10-CM | POA: Diagnosis not present

## 2017-12-11 DIAGNOSIS — Z7984 Long term (current) use of oral hypoglycemic drugs: Secondary | ICD-10-CM | POA: Diagnosis not present

## 2017-12-12 DIAGNOSIS — Z23 Encounter for immunization: Secondary | ICD-10-CM | POA: Diagnosis not present

## 2018-01-10 ENCOUNTER — Ambulatory Visit
Admission: RE | Admit: 2018-01-10 | Discharge: 2018-01-10 | Disposition: A | Discharge status: Home / Self Care | Payer: Medicare Other | Source: Ambulatory Visit | Attending: Family Medicine | Admitting: Family Medicine

## 2018-01-10 DIAGNOSIS — Z1231 Encounter for screening mammogram for malignant neoplasm of breast: Secondary | ICD-10-CM | POA: Diagnosis not present

## 2018-05-22 DIAGNOSIS — I129 Hypertensive chronic kidney disease with stage 1 through stage 4 chronic kidney disease, or unspecified chronic kidney disease: Secondary | ICD-10-CM | POA: Diagnosis not present

## 2018-05-22 DIAGNOSIS — N183 Chronic kidney disease, stage 3 (moderate): Secondary | ICD-10-CM | POA: Diagnosis not present

## 2018-05-22 DIAGNOSIS — Z0000 Encounter for general adult medical examination without abnormal findings: Secondary | ICD-10-CM | POA: Diagnosis not present

## 2018-05-22 DIAGNOSIS — E1121 Type 2 diabetes mellitus with diabetic nephropathy: Secondary | ICD-10-CM | POA: Diagnosis not present

## 2018-08-23 DIAGNOSIS — E119 Type 2 diabetes mellitus without complications: Secondary | ICD-10-CM | POA: Diagnosis not present

## 2018-08-23 DIAGNOSIS — H40033 Anatomical narrow angle, bilateral: Secondary | ICD-10-CM | POA: Diagnosis not present

## 2018-10-23 DIAGNOSIS — E1121 Type 2 diabetes mellitus with diabetic nephropathy: Secondary | ICD-10-CM | POA: Diagnosis not present

## 2018-10-23 DIAGNOSIS — M109 Gout, unspecified: Secondary | ICD-10-CM | POA: Diagnosis not present

## 2018-11-22 DIAGNOSIS — N1831 Chronic kidney disease, stage 3a: Secondary | ICD-10-CM | POA: Diagnosis not present

## 2018-11-22 DIAGNOSIS — E1121 Type 2 diabetes mellitus with diabetic nephropathy: Secondary | ICD-10-CM | POA: Diagnosis not present

## 2018-11-22 DIAGNOSIS — I129 Hypertensive chronic kidney disease with stage 1 through stage 4 chronic kidney disease, or unspecified chronic kidney disease: Secondary | ICD-10-CM | POA: Diagnosis not present

## 2018-11-22 DIAGNOSIS — E7800 Pure hypercholesterolemia, unspecified: Secondary | ICD-10-CM | POA: Diagnosis not present

## 2019-03-24 ENCOUNTER — Ambulatory Visit: Payer: Medicare Other

## 2019-05-28 DIAGNOSIS — I129 Hypertensive chronic kidney disease with stage 1 through stage 4 chronic kidney disease, or unspecified chronic kidney disease: Secondary | ICD-10-CM | POA: Diagnosis not present

## 2019-05-28 DIAGNOSIS — Z0000 Encounter for general adult medical examination without abnormal findings: Secondary | ICD-10-CM | POA: Diagnosis not present

## 2019-05-28 DIAGNOSIS — E1121 Type 2 diabetes mellitus with diabetic nephropathy: Secondary | ICD-10-CM | POA: Diagnosis not present

## 2019-05-28 DIAGNOSIS — N1831 Chronic kidney disease, stage 3a: Secondary | ICD-10-CM | POA: Diagnosis not present

## 2019-06-13 DIAGNOSIS — N184 Chronic kidney disease, stage 4 (severe): Secondary | ICD-10-CM | POA: Diagnosis not present

## 2019-11-21 DIAGNOSIS — I129 Hypertensive chronic kidney disease with stage 1 through stage 4 chronic kidney disease, or unspecified chronic kidney disease: Secondary | ICD-10-CM | POA: Diagnosis not present

## 2019-11-21 DIAGNOSIS — E1121 Type 2 diabetes mellitus with diabetic nephropathy: Secondary | ICD-10-CM | POA: Diagnosis not present

## 2019-11-21 DIAGNOSIS — E559 Vitamin D deficiency, unspecified: Secondary | ICD-10-CM | POA: Diagnosis not present

## 2019-11-21 DIAGNOSIS — N1831 Chronic kidney disease, stage 3a: Secondary | ICD-10-CM | POA: Diagnosis not present

## 2019-12-17 DIAGNOSIS — R809 Proteinuria, unspecified: Secondary | ICD-10-CM | POA: Diagnosis not present

## 2019-12-17 DIAGNOSIS — N179 Acute kidney failure, unspecified: Secondary | ICD-10-CM | POA: Diagnosis not present

## 2019-12-17 DIAGNOSIS — N183 Chronic kidney disease, stage 3 unspecified: Secondary | ICD-10-CM | POA: Diagnosis not present

## 2019-12-17 DIAGNOSIS — N184 Chronic kidney disease, stage 4 (severe): Secondary | ICD-10-CM | POA: Diagnosis not present

## 2019-12-17 DIAGNOSIS — I129 Hypertensive chronic kidney disease with stage 1 through stage 4 chronic kidney disease, or unspecified chronic kidney disease: Secondary | ICD-10-CM | POA: Diagnosis not present

## 2019-12-20 ENCOUNTER — Encounter: Payer: Self-pay | Admitting: Nephrology

## 2019-12-20 NOTE — Progress Notes (Signed)
Patient seen by me on 12/17/2019 as a new referral for evaluation of rise in creatinine.   Background: Patient is a 85 year old AAF with a PMH of longstanding resistant HTN (negative for renal artery stenosis in the past via CTA in 2009, has good compliance with medications), white coat HTN, HLD, osteopenia, vit D deficiency, gout, diet controlled DM2 (now off metformin).  Patient's baseline creatinine had been around 1.2-1.3 however in 05/2019 Cr had risen to 2 and then on 11/21/2019 her Cr worsened further to 4.9 without a clear cut etiology. Losartan discontinued at that time and her amlodipine was up-titrated to 10mg  daily. By the time she saw me on 11/9, her Cr was 5 (unchanged) however upon further evaluation her UA revealed 3+ protein and 2+ blood (without any significant RBC's on urine sediment examination). Her UPC and UACR was 10g and 5.9g respectively which is concerning given her clinical scenario. Her serologic workup for proteinuria (SPEP w/ IF, FLC, Hep B, Hep C, HIV) were unremarkable. CPK only 467. Of note, her systolic pressure in the office was 230 mmHg.  Overall, I am concerned that she might have a collapsing FSGS picture which is carries a bad renal prognosis.  I advised her and her daughter in law to be admitted at [facility] for expedited work up and expedited renal biopsy especially since her blood pressure is a huge limiting factor in obtaining a kidney biopsy. Per their preference, they would like to come to the ER on Saturday 11/13 which is reasonable. I asked her to stop taking aspirin (last dose 11/12).  Recommendations: -please consult & call nephrology, discussed with Dr. Carolin Sicks who will be on-call on Saturday -consult IR for kidney biopsy -low threshold to utilize IV anti-hypertensives depending on her BP (for kidney biopsies, I usually target a goal BP <160/90). Consider IV fluids if clinically hypovolemic -repeat labs and UA w/ microscopy -if RBC's present in urine  sediment, please check ANCA, ANA, anti dsDNA Ab, C3, C4, and anti-GBM -obtain renal artery duplex complete & renal u/s -do not restart aspirin in anticipation of biopsy  Gean Quint, MD [facility]

## 2019-12-21 ENCOUNTER — Encounter (HOSPITAL_COMMUNITY): Payer: Self-pay | Admitting: Emergency Medicine

## 2019-12-21 ENCOUNTER — Other Ambulatory Visit: Payer: Self-pay

## 2019-12-21 ENCOUNTER — Inpatient Hospital Stay (HOSPITAL_COMMUNITY): Payer: Medicare Other

## 2019-12-21 ENCOUNTER — Inpatient Hospital Stay (HOSPITAL_COMMUNITY)
Admission: EM | Admit: 2019-12-21 | Discharge: 2019-12-26 | DRG: 304 | Disposition: A | Discharge status: Hospice | Payer: Medicare Other | Attending: Internal Medicine | Admitting: Internal Medicine

## 2019-12-21 DIAGNOSIS — M1990 Unspecified osteoarthritis, unspecified site: Secondary | ICD-10-CM | POA: Diagnosis present

## 2019-12-21 DIAGNOSIS — J90 Pleural effusion, not elsewhere classified: Secondary | ICD-10-CM | POA: Diagnosis not present

## 2019-12-21 DIAGNOSIS — I43 Cardiomyopathy in diseases classified elsewhere: Secondary | ICD-10-CM | POA: Diagnosis not present

## 2019-12-21 DIAGNOSIS — Z7984 Long term (current) use of oral hypoglycemic drugs: Secondary | ICD-10-CM | POA: Diagnosis not present

## 2019-12-21 DIAGNOSIS — I130 Hypertensive heart and chronic kidney disease with heart failure and stage 1 through stage 4 chronic kidney disease, or unspecified chronic kidney disease: Secondary | ICD-10-CM | POA: Diagnosis not present

## 2019-12-21 DIAGNOSIS — N189 Chronic kidney disease, unspecified: Secondary | ICD-10-CM | POA: Diagnosis not present

## 2019-12-21 DIAGNOSIS — I509 Heart failure, unspecified: Secondary | ICD-10-CM | POA: Diagnosis not present

## 2019-12-21 DIAGNOSIS — I161 Hypertensive emergency: Secondary | ICD-10-CM | POA: Diagnosis not present

## 2019-12-21 DIAGNOSIS — D509 Iron deficiency anemia, unspecified: Secondary | ICD-10-CM | POA: Diagnosis not present

## 2019-12-21 DIAGNOSIS — Z8249 Family history of ischemic heart disease and other diseases of the circulatory system: Secondary | ICD-10-CM

## 2019-12-21 DIAGNOSIS — I4891 Unspecified atrial fibrillation: Secondary | ICD-10-CM | POA: Diagnosis present

## 2019-12-21 DIAGNOSIS — E1122 Type 2 diabetes mellitus with diabetic chronic kidney disease: Secondary | ICD-10-CM | POA: Diagnosis present

## 2019-12-21 DIAGNOSIS — J9601 Acute respiratory failure with hypoxia: Secondary | ICD-10-CM | POA: Diagnosis not present

## 2019-12-21 DIAGNOSIS — M109 Gout, unspecified: Secondary | ICD-10-CM | POA: Diagnosis not present

## 2019-12-21 DIAGNOSIS — N179 Acute kidney failure, unspecified: Secondary | ICD-10-CM | POA: Diagnosis not present

## 2019-12-21 DIAGNOSIS — E1169 Type 2 diabetes mellitus with other specified complication: Secondary | ICD-10-CM | POA: Diagnosis not present

## 2019-12-21 DIAGNOSIS — Z20822 Contact with and (suspected) exposure to covid-19: Secondary | ICD-10-CM | POA: Diagnosis not present

## 2019-12-21 DIAGNOSIS — I5031 Acute diastolic (congestive) heart failure: Secondary | ICD-10-CM | POA: Diagnosis present

## 2019-12-21 DIAGNOSIS — E859 Amyloidosis, unspecified: Secondary | ICD-10-CM | POA: Diagnosis present

## 2019-12-21 DIAGNOSIS — I340 Nonrheumatic mitral (valve) insufficiency: Secondary | ICD-10-CM | POA: Diagnosis present

## 2019-12-21 DIAGNOSIS — I160 Hypertensive urgency: Secondary | ICD-10-CM | POA: Diagnosis not present

## 2019-12-21 DIAGNOSIS — R54 Age-related physical debility: Secondary | ICD-10-CM | POA: Diagnosis not present

## 2019-12-21 DIAGNOSIS — J948 Other specified pleural conditions: Secondary | ICD-10-CM | POA: Diagnosis not present

## 2019-12-21 DIAGNOSIS — E785 Hyperlipidemia, unspecified: Secondary | ICD-10-CM | POA: Diagnosis present

## 2019-12-21 DIAGNOSIS — E1129 Type 2 diabetes mellitus with other diabetic kidney complication: Secondary | ICD-10-CM | POA: Diagnosis not present

## 2019-12-21 DIAGNOSIS — M8580 Other specified disorders of bone density and structure, unspecified site: Secondary | ICD-10-CM | POA: Diagnosis present

## 2019-12-21 DIAGNOSIS — I129 Hypertensive chronic kidney disease with stage 1 through stage 4 chronic kidney disease, or unspecified chronic kidney disease: Secondary | ICD-10-CM | POA: Diagnosis not present

## 2019-12-21 DIAGNOSIS — Z888 Allergy status to other drugs, medicaments and biological substances status: Secondary | ICD-10-CM

## 2019-12-21 DIAGNOSIS — Z66 Do not resuscitate: Secondary | ICD-10-CM | POA: Diagnosis not present

## 2019-12-21 DIAGNOSIS — J9811 Atelectasis: Secondary | ICD-10-CM | POA: Diagnosis not present

## 2019-12-21 DIAGNOSIS — Z79899 Other long term (current) drug therapy: Secondary | ICD-10-CM

## 2019-12-21 DIAGNOSIS — Z23 Encounter for immunization: Secondary | ICD-10-CM

## 2019-12-21 DIAGNOSIS — N183 Chronic kidney disease, stage 3 unspecified: Secondary | ICD-10-CM | POA: Diagnosis not present

## 2019-12-21 DIAGNOSIS — I10 Essential (primary) hypertension: Secondary | ICD-10-CM

## 2019-12-21 DIAGNOSIS — N049 Nephrotic syndrome with unspecified morphologic changes: Secondary | ICD-10-CM | POA: Diagnosis present

## 2019-12-21 DIAGNOSIS — E854 Organ-limited amyloidosis: Secondary | ICD-10-CM | POA: Diagnosis not present

## 2019-12-21 DIAGNOSIS — I313 Pericardial effusion (noninflammatory): Secondary | ICD-10-CM | POA: Diagnosis not present

## 2019-12-21 HISTORY — DX: Other specified health status: Z78.9

## 2019-12-21 HISTORY — DX: Cardiac murmur, unspecified: R01.1

## 2019-12-21 LAB — URINALYSIS, ROUTINE W REFLEX MICROSCOPIC
Bilirubin Urine: NEGATIVE
Glucose, UA: 50 mg/dL — AB
Ketones, ur: NEGATIVE mg/dL
Leukocytes,Ua: NEGATIVE
Nitrite: NEGATIVE
Protein, ur: >=300 mg/dL — AB
Specific Gravity, Urine: 1.023 (ref 1.005–1.030)
pH: 5 (ref 5.0–8.0)

## 2019-12-21 LAB — IRON AND TIBC
Iron: 24 ug/dL — ABNORMAL LOW (ref 28–170)
Saturation Ratios: 12 % (ref 10.4–31.8)
TIBC: 209 ug/dL — ABNORMAL LOW (ref 250–450)
UIBC: 185 ug/dL

## 2019-12-21 LAB — CBC WITH DIFFERENTIAL/PLATELET
Abs Immature Granulocytes: 0.01 10*3/uL (ref 0.00–0.07)
Basophils Absolute: 0 10*3/uL (ref 0.0–0.1)
Basophils Relative: 1 %
Eosinophils Absolute: 0.4 10*3/uL (ref 0.0–0.5)
Eosinophils Relative: 7 %
HCT: 26.6 % — ABNORMAL LOW (ref 36.0–46.0)
Hemoglobin: 8.3 g/dL — ABNORMAL LOW (ref 12.0–15.0)
Immature Granulocytes: 0 %
Lymphocytes Relative: 16 %
Lymphs Abs: 0.9 10*3/uL (ref 0.7–4.0)
MCH: 26.8 pg (ref 26.0–34.0)
MCHC: 31.2 g/dL (ref 30.0–36.0)
MCV: 85.8 fL (ref 80.0–100.0)
Monocytes Absolute: 0.3 10*3/uL (ref 0.1–1.0)
Monocytes Relative: 5 %
Neutro Abs: 4.2 10*3/uL (ref 1.7–7.7)
Neutrophils Relative %: 71 %
Platelets: 278 10*3/uL (ref 150–400)
RBC: 3.1 MIL/uL — ABNORMAL LOW (ref 3.87–5.11)
RDW: 15.1 % (ref 11.5–15.5)
WBC: 5.8 10*3/uL (ref 4.0–10.5)
nRBC: 0 % (ref 0.0–0.2)

## 2019-12-21 LAB — BASIC METABOLIC PANEL
Anion gap: 11 (ref 5–15)
BUN: 52 mg/dL — ABNORMAL HIGH (ref 8–23)
CO2: 21 mmol/L — ABNORMAL LOW (ref 22–32)
Calcium: 8.5 mg/dL — ABNORMAL LOW (ref 8.9–10.3)
Chloride: 113 mmol/L — ABNORMAL HIGH (ref 98–111)
Creatinine, Ser: 5.54 mg/dL — ABNORMAL HIGH (ref 0.44–1.00)
GFR, Estimated: 7 mL/min — ABNORMAL LOW (ref >60)
Glucose, Bld: 93 mg/dL (ref 70–99)
Potassium: 4.3 mmol/L (ref 3.5–5.1)
Sodium: 145 mmol/L (ref 135–145)

## 2019-12-21 LAB — MRSA PCR SCREENING: MRSA by PCR: NEGATIVE

## 2019-12-21 LAB — GLUCOSE, CAPILLARY
Glucose-Capillary: 72 mg/dL (ref 70–99)
Glucose-Capillary: 188 mg/dL — ABNORMAL HIGH (ref 70–99)

## 2019-12-21 LAB — RESPIRATORY PANEL BY RT PCR (FLU A&B, COVID)
Influenza A by PCR: NEGATIVE
Influenza B by PCR: NEGATIVE
SARS Coronavirus 2 by RT PCR: NEGATIVE

## 2019-12-21 LAB — PROTEIN / CREATININE RATIO, URINE
Creatinine, Urine: 231.22 mg/dL
Protein Creatinine Ratio: 9.23 mg/mg{Cre} — ABNORMAL HIGH (ref 0.00–0.15)
Total Protein, Urine: 2134 mg/dL

## 2019-12-21 LAB — HEMOGLOBIN A1C
Hgb A1c MFr Bld: 5 % (ref 4.8–5.6)
Mean Plasma Glucose: 96.8 mg/dL

## 2019-12-21 LAB — FERRITIN: Ferritin: 66 ng/mL (ref 11–307)

## 2019-12-21 MED ORDER — AMLODIPINE BESYLATE 5 MG PO TABS: 5.0000 mg | ORAL_TABLET | Freq: Every day | ORAL | Status: DC

## 2019-12-21 MED ORDER — CHLORHEXIDINE GLUCONATE CLOTH 2 % EX PADS
6.0000 | MEDICATED_PAD | Freq: Every day | CUTANEOUS | Status: DC
  Administered 2019-12-23: 6 via TOPICAL

## 2019-12-21 MED ORDER — CLONIDINE HCL 0.2 MG PO TABS
0.2000 mg | ORAL_TABLET | Freq: Two times a day (BID) | ORAL | Status: DC
  Administered 2019-12-21 – 2019-12-26 (×10): 0.2 mg via ORAL
  Filled 2019-12-21 (×10): qty 1

## 2019-12-21 MED ORDER — NICARDIPINE HCL IN NACL 20-0.86 MG/200ML-% IV SOLN
3.0000 mg/h | INTRAVENOUS | Status: DC
  Administered 2019-12-21: 5 mg/h via INTRAVENOUS
  Administered 2019-12-21: 10 mg/h via INTRAVENOUS
  Administered 2019-12-21 (×2): 7.5 mg/h via INTRAVENOUS
  Filled 2019-12-21 (×4): qty 200

## 2019-12-21 MED ORDER — INSULIN ASPART 100 UNIT/ML ~~LOC~~ SOLN: 0.0000 [IU] | Freq: Every day | SUBCUTANEOUS | Status: DC

## 2019-12-21 MED ORDER — SODIUM CHLORIDE 0.9 % IV SOLN: INTRAVENOUS | Status: DC

## 2019-12-21 MED ORDER — INSULIN ASPART 100 UNIT/ML ~~LOC~~ SOLN: 0.0000 [IU] | Freq: Three times a day (TID) | SUBCUTANEOUS | Status: DC

## 2019-12-21 MED ORDER — ACETAMINOPHEN 650 MG RE SUPP: 650.0000 mg | Freq: Four times a day (QID) | RECTAL | Status: DC | PRN

## 2019-12-21 MED ORDER — NICARDIPINE HCL IN NACL 40-0.83 MG/200ML-% IV SOLN
3.0000 mg/h | INTRAVENOUS | Status: DC
  Administered 2019-12-21: 10 mg/h via INTRAVENOUS
  Administered 2019-12-22: 3 mg/h via INTRAVENOUS
  Administered 2019-12-23: 2.5 mg/h via INTRAVENOUS
  Administered 2019-12-23: 5 mg/h via INTRAVENOUS
  Administered 2019-12-23: 7.5 mg/h via INTRAVENOUS
  Administered 2019-12-24: 5 mg/h via INTRAVENOUS
  Administered 2019-12-24: 3 mg/h via INTRAVENOUS
  Filled 2019-12-21 (×8): qty 200

## 2019-12-21 MED ORDER — SODIUM CHLORIDE 0.9% FLUSH
3.0000 mL | Freq: Two times a day (BID) | INTRAVENOUS | Status: DC
  Administered 2019-12-21 – 2019-12-25 (×6): 3 mL via INTRAVENOUS

## 2019-12-21 MED ORDER — DOXAZOSIN MESYLATE 2 MG PO TABS
2.0000 mg | ORAL_TABLET | Freq: Every day | ORAL | Status: DC
  Administered 2019-12-22 – 2019-12-23 (×2): 2 mg via ORAL
  Filled 2019-12-21 (×3): qty 1

## 2019-12-21 MED ORDER — ACETAMINOPHEN 325 MG PO TABS
650.0000 mg | ORAL_TABLET | Freq: Four times a day (QID) | ORAL | Status: DC | PRN
  Administered 2019-12-22 – 2019-12-24 (×3): 650 mg via ORAL
  Filled 2019-12-21 (×3): qty 2

## 2019-12-21 NOTE — Assessment & Plan Note (Addendum)
-   patient has had difficulty at least recently with BP control (elevated SBP 230 in office recently); patient states compliance with meds - renal function has rapidly deteriorated recently (concern for underlying glomerular disease per neprho, which may be contributed from by severely elevated BP) - continue home meds - continue cardene; weaning as able; okay to pursue BP <140/90 - increase home amlodipine to 10 mg - BP still uncontrolled as cardene titrated up overnight of 11/14; add on hydralazine this am and monitor BP response

## 2019-12-21 NOTE — Hospital Course (Addendum)
[name] is an 85 yo AA female with PMH resistant HTN, DMII, HLD, CKD (unknown stage), gout who presented after she was told to come for an uprising creatinine outpatient.  She follows with Dr. Thedore Mins and baseline creat is 1.2-1.3. Her creatinine has started rising for unknown reasons; she has also had recent uncontrolled HTN (per note, office SBP 230 mmHg).  She was recommended to present to the ER for BP control, renal biopsy, and further workup/evaluation.   On assessment in the ER her BP was 210s-250s/70s-100s). She states compliance at home with her BP regimen without any missed doses recently (at home on amlodipine, clonidine, doxazosin, toprol).   She was recommended to be placed on nicardipine drip given excessively elevated pressures despite home regimen compliance. Nephrology was consulted and IR with plans for renal biopsy after admission and BP stabilization.   She is accompanied by her son in the ER. She denies any headaches, vision changes, chest pain, shortness of breath, abdominal pain.  The morning after admission she was also noted to be hypoxic requiring oxygen overnight.  Her renal ultrasound had noted pleural effusions.  She underwent CXR which showed a left pleural effusion; IR was consulted and patient underwent a thoracentesis on the left which removed 800 cc yellow fluid.

## 2019-12-21 NOTE — ED Triage Notes (Signed)
Pt. Stated, Im here cayuse of BP its been running high and my Dr. Loetta Rough get it down said to come here.

## 2019-12-21 NOTE — Consult Note (Signed)
[facility] Nephrology Consultation Note  Requesting MD: Dr Dwyane Dee Reason for consult: AKI  HPI:  Valerie Bonilla is a 85 y.o. female with history of longstanding uncontrolled hypertension, HLD, gout, DM, CKD with baseline creatinine level 1.2-1.3 who was sent to the hospital for uncontrolled hypertension and worsening renal failure, seen as a consultation for the evaluation of AKI.  The patient was seen by Dr. Candiss Norse at [place] [facility] for AKI with creatinine level of 5.  Further evaluation showed nephrotic range proteinuria around 10 g without any microscopic hematuria.  SPEP, kappa lambda ratio, hepatitis B, C, HIV was unremarkable.  She was however found to have systolic blood pressure > 200 therefore directed to the hospital for better blood pressure control before kidney biopsy. She is on amlodipine, clonidine, Cardura, metoprolol for hypertension.  Currently not on ACE or ARB. Denies use of NSAIDs. In the ER, the blood pressure was 241/99, in room air.  She was comfortable.  Her son at bedside.  She denied headache, dizziness, nausea vomiting chest pain shortness of breath.  No urinary complaint.  Now being admitted for further evaluation. The blood pressure is gradually improving with Cardene drip. The labs showed BUN 52, creatinine 5.54, CO2 21, hemoglobin 8.3.  Covid negative.  Creatinine, Ser  Date/Time Value Ref Range Status  12/21/2019 09:30 AM 5.54 (H) 0.44 - 1.00 mg/dL Final     PMHx:   Past Medical History:  Diagnosis Date  . Allergic rhinitis   . CKD (chronic kidney disease)   . Diabetes ([place])   . Gout   . HTN (hypertension)   . Hypercholesteremia   . Hyperlipidemia   . Osteoarthritis   . Osteopenia   . Plantar fasciitis   . Vitamin D deficiency     Past Surgical History:  Procedure Laterality Date  . CESAREAN SECTION    . COLONOSCOPY    . WISDOM TOOTH EXTRACTION      Family Hx: No family history on file.  Social History:   reports that she has never smoked. She has never used smokeless tobacco. No history on file for alcohol use and drug use.  Allergies:  Allergies  Allergen Reactions  . Ace Inhibitors     Medications: Prior to Admission medications   Medication Sig Start Date End Date Taking? Authorizing Provider  amLODipine (NORVASC) 10 MG tablet Take 1 tablet (10 mg total) by mouth daily. 10/30/13   Jettie Booze, MD  cloNIDine (CATAPRES) 0.2 MG tablet Take 1 tablet (0.2 mg total) by mouth 2 (two) times daily. 10/04/13   Jettie Booze, MD  doxazosin (CARDURA) 2 MG tablet TAKE 1 TABLET BY MOUTH DAILY. 04/21/14   Jettie Booze, MD  fluorometholone (FML) 0.1 % ophthalmic suspension Place 1 drop into both eyes daily.  02/15/13   [provider]  glimepiride (AMARYL) 2 MG tablet Take 2 mg by mouth daily with breakfast.  02/08/13   [provider]  metFORMIN (GLUCOPHAGE) 500 MG tablet Take 1 tablet by mouth 2 (two) times daily. 01/21/13   [provider]  metoprolol succinate (TOPROL-XL) 100 MG 24 hr tablet Take 100 mg by mouth daily. 1 1/2 tab daily 01/16/13   [provider]  RESTASIS 0.05 % ophthalmic emulsion Place 1 drop into both eyes daily. As directed 01/19/13   [provider]  simvastatin (ZOCOR) 20 MG tablet Take 1 tablet by mouth daily. 01/07/13   [provider]  ULORIC 40 MG tablet Take  1 tablet by mouth daily. 12/25/12   [provider]  valsartan-hydrochlorothiazide (DIOVAN-HCT) 320-25 MG per tablet Take 1 tablet by mouth daily. 03/11/13   Jettie Booze, MD    I have reviewed the patient's current medications.  Labs:  Results for orders placed or performed during the hospital encounter of 12/21/19 (from the past 48 hour(s))  CBC with Differential/Platelet     Status: Abnormal   Collection Time: 12/21/19  9:30 AM  Result Value Ref Range   WBC 5.8 4.0 - 10.5 K/uL   RBC 3.10 (L) 3.87 - 5.11 MIL/uL   Hemoglobin 8.3  (L) 12.0 - 15.0 g/dL   HCT 26.6 (L) 36 - 46 %   MCV 85.8 80.0 - 100.0 fL   MCH 26.8 26.0 - 34.0 pg   MCHC 31.2 30.0 - 36.0 g/dL   RDW 15.1 11.5 - 15.5 %   Platelets 278 150 - 400 K/uL   nRBC 0.0 0.0 - 0.2 %   Neutrophils Relative % 71 %   Neutro Abs 4.2 1.7 - 7.7 K/uL   Lymphocytes Relative 16 %   Lymphs Abs 0.9 0.7 - 4.0 K/uL   Monocytes Relative 5 %   Monocytes Absolute 0.3 0.1 - 1.0 K/uL   Eosinophils Relative 7 %   Eosinophils Absolute 0.4 0.0 - 0.5 K/uL   Basophils Relative 1 %   Basophils Absolute 0.0 0.0 - 0.1 K/uL   Immature Granulocytes 0 %   Abs Immature Granulocytes 0.01 0.00 - 0.07 K/uL    Comment: Performed at [facility], [address] [place], [place] [address]  Basic metabolic panel     Status: Abnormal   Collection Time: 12/21/19  9:30 AM  Result Value Ref Range   Sodium 145 135 - 145 mmol/L   Potassium 4.3 3.5 - 5.1 mmol/L   Chloride 113 (H) 98 - 111 mmol/L   CO2 21 (L) 22 - 32 mmol/L   Glucose, Bld 93 70 - 99 mg/dL    Comment: Glucose reference range applies only to samples taken after fasting for at least 8 hours.   BUN 52 (H) 8 - 23 mg/dL   Creatinine, Ser 5.54 (H) 0.44 - 1.00 mg/dL   Calcium 8.5 (L) 8.9 - 10.3 mg/dL   GFR, Estimated 7 (L) >60 mL/min    Comment: (NOTE) Calculated using the CKD-EPI Creatinine Equation (2021)    Anion gap 11 5 - 15    Comment: Performed at [address] [place], [place] [address]  Respiratory Panel by RT PCR (Flu A&B, Covid) - Nasopharyngeal Swab     Status: None   Collection Time: 12/21/19  9:30 AM   Specimen: Nasopharyngeal Swab  Result Value Ref Range   SARS Coronavirus 2 by RT PCR NEGATIVE NEGATIVE    Comment: (NOTE) SARS-CoV-2 target nucleic acids are NOT DETECTED.  The SARS-CoV-2 RNA is generally detectable in upper respiratoy specimens during the acute phase of infection. The lowest concentration of SARS-CoV-2 viral copies this assay can detect is 131 copies/mL. A negative  result does not preclude SARS-Cov-2 infection and should not be used as the sole basis for treatment or other patient management decisions. A negative result may occur with  improper specimen collection/handling, submission of specimen other than nasopharyngeal swab, presence of viral mutation(s) within the areas targeted by this assay, and inadequate number of viral copies (<131 copies/mL). A negative result must be combined with clinical observations, patient history, and epidemiological information.  The expected result is Negative.  Fact Sheet for Patients:  PinkCheek.be  Fact Sheet for Healthcare Providers:  GravelBags.it  This test is no t yet approved or cleared by the [place] FDA and  has been authorized for detection and/or diagnosis of SARS-CoV-2 by FDA under an Emergency Use Authorization (EUA). This EUA will remain  in effect (meaning this test can be used) for the duration of the COVID-19 declaration under Section 564(b)(1) of the Act, 21 U.S.C. section 360bbb-3(b)(1), unless the authorization is terminated or revoked sooner.     Influenza A by PCR NEGATIVE NEGATIVE   Influenza B by PCR NEGATIVE NEGATIVE    Comment: (NOTE) The Xpert Xpress SARS-CoV-2/FLU/RSV assay is intended as an aid in  the diagnosis of influenza from Nasopharyngeal swab specimens and  should not be used as a sole basis for treatment. Nasal washings and  aspirates are unacceptable for Xpert Xpress SARS-CoV-2/FLU/RSV  testing.  Fact Sheet for Patients: PinkCheek.be  Fact Sheet for Healthcare Providers: GravelBags.it  This test is not yet approved or cleared by the [place] FDA and  has been authorized for detection and/or diagnosis of SARS-CoV-2 by  FDA under an Emergency Use Authorization (EUA). This EUA will remain  in effect (meaning this test can be used) for the  duration of the  Covid-19 declaration under Section 564(b)(1) of the Act, 21  U.S.C. section 360bbb-3(b)(1), unless the authorization is  terminated or revoked. Performed at [facility], [place] [address] [place],  [address]      ROS:  Pertinent items noted in HPI and remainder of comprehensive ROS otherwise negative.  Physical Exam: Vitals:   12/21/19 1130 12/21/19 1135  BP: (!) 223/70 (!) 181/63  Pulse: 64 62  Resp: 13 12  Temp:    SpO2: 99% 99%     General exam: Appears calm and comfortable  Respiratory system: Clear to auscultation. Respiratory effort normal. No wheezing or crackle Cardiovascular system: S1 & S2 heard, RRR.  No pedal edema. Gastrointestinal system: Abdomen is nondistended, soft and nontender. Normal bowel sounds heard. Central nervous system: Alert and oriented. No focal neurological deficits. Extremities: Symmetric 5 x 5 power. Skin: No rashes, lesions or ulcers Psychiatry: Judgement and insight appear normal. Mood & affect appropriate.   Assessment/Plan:  #Acute kidney injury on CKD probably because of underlying glomerulonephritis and hypertensive urgency:  Urinalysis with nephrotic range proteinuria without hematuria. We will check UA and serology as below. Ordered Doppler kidney ultrasound Planning for kidney biopsy. Monitor BMP, strict ins and out, avoid nephrotoxins.  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 Around 10 g of proteinuria.  Secondary work-up including SPEP, kappa lambda ratio, hep B, hep C, HIV unremarkable.  CPK only elevated to 467. Repeating UA, spot urine PCR. Order ANA, ANCA, C3, C4, anti-GBM. I will consult IR for kidney biopsy probably on Monday.  Needs better blood pressure control.  Hold aspirin. She can eat and drink.  #Hypertensive urgency: Blood pressure gradually improving with Cardene drip.  Recommend to resume home medication and gradually wean Cardene  drip.  #Anemia: Checking iron studies.  Further evaluation including stool occult blood test etc. defer to primary team.  Thank you for the consult.  We will follow with you.  Abubakar Crispo Tanna Furry 12/21/2019, 12:08 PM  [facility].

## 2019-12-21 NOTE — ED Provider Notes (Addendum)
[facility] EMERGENCY DEPARTMENT Provider Note   CSN: [id number] Arrival date & time: 12/21/19  2025     History Chief Complaint  Patient presents with  . Hypertension    Valerie Bonilla is a 85 y.o. female.  85 year old female presents due to worsening hypertension as well as renal function.  Seen by her nephrologist yesterday due to hypertension and increased creatinine.  Was recommended for admission yesterday but patient wanted to wait until today.  She denies any symptoms such as headache, nausea or vomiting, chest pain or shortness of breath.  She otherwise feels at her baseline.        Past Medical History:  Diagnosis Date  . Allergic rhinitis   . CKD (chronic kidney disease)   . Diabetes ([facility])   . Gout   . HTN (hypertension)   . Hypercholesteremia   . Hyperlipidemia   . Osteoarthritis   . Osteopenia   . Plantar fasciitis   . Vitamin D deficiency     Patient Active Problem List   Diagnosis Date Noted  . Hypercholesteremia   . HTN (hypertension)   . Diabetes ([facility])   . Vitamin D deficiency   . Allergic rhinitis   . Osteoarthritis   . Hyperlipidemia   . Plantar fasciitis   . Gout   . Osteopenia   . CKD (chronic kidney disease)     Past Surgical History:  Procedure Laterality Date  . CESAREAN SECTION    . COLONOSCOPY    . WISDOM TOOTH EXTRACTION       OB History   No obstetric history on file.     No family history on file.  Social History   Tobacco Use  . Smoking status: Never Smoker  . Smokeless tobacco: Never Used  Substance Use Topics  . Alcohol use: Not on file  . Drug use: Not on file    Home Medications Prior to Admission medications   Medication Sig Start Date End Date Taking? Authorizing Provider  amLODipine (NORVASC) 10 MG tablet Take 1 tablet (10 mg total) by mouth daily. 10/30/13   Corky Crafts, MD  cloNIDine (CATAPRES) 0.2 MG tablet Take 1 tablet (0.2 mg total) by mouth 2 (two) times daily.  10/04/13   Corky Crafts, MD  doxazosin (CARDURA) 2 MG tablet TAKE 1 TABLET BY MOUTH DAILY. 04/21/14   Corky Crafts, MD  fluorometholone (FML) 0.1 % ophthalmic suspension Place 1 drop into both eyes daily.  02/15/13   [provider]  glimepiride (AMARYL) 2 MG tablet Take 2 mg by mouth daily with breakfast.  02/08/13   [provider]  metFORMIN (GLUCOPHAGE) 500 MG tablet Take 1 tablet by mouth 2 (two) times daily. 01/21/13   [provider]  metoprolol succinate (TOPROL-XL) 100 MG 24 hr tablet Take 100 mg by mouth daily. 1 1/2 tab daily 01/16/13   [provider]  RESTASIS 0.05 % ophthalmic emulsion Place 1 drop into both eyes daily. As directed 01/19/13   [provider]  simvastatin (ZOCOR) 20 MG tablet Take 1 tablet by mouth daily. 01/07/13   [provider]  ULORIC 40 MG tablet Take 1 tablet by mouth daily. 12/25/12   [provider]  valsartan-hydrochlorothiazide (DIOVAN-HCT) 320-25 MG per tablet Take 1 tablet by mouth daily. 03/11/13   Corky Crafts, MD    Allergies    Ace inhibitors  Review of Systems   Review of Systems  All other systems reviewed and are  negative.   Physical Exam Updated Vital Signs BP (!) 252/74 (BP Location: Left Arm)   Pulse 68   Temp 98.1 F (36.7 C) (Oral)   Resp 16   Ht 1.549 m (5\' 1" )   Wt 50.8 kg   SpO2 98%   BMI 21.16 kg/m   Physical Exam Vitals and nursing note reviewed.  Constitutional:      General: She is not in acute distress.    Appearance: Normal appearance. She is well-developed. She is not toxic-appearing.  HENT:     Head: Normocephalic and atraumatic.  Eyes:     General: Lids are normal.     Conjunctiva/sclera: Conjunctivae normal.     Pupils: Pupils are equal, round, and reactive to light.  Neck:     Thyroid: No thyroid mass.     Trachea: No tracheal deviation.  Cardiovascular:     Rate and Rhythm: Normal rate and regular rhythm.     Heart sounds:  Normal heart sounds. No murmur heard.  No gallop.   Pulmonary:     Effort: Pulmonary effort is normal. No respiratory distress.     Breath sounds: Normal breath sounds. No stridor. No decreased breath sounds, wheezing, rhonchi or rales.  Abdominal:     General: Bowel sounds are normal. There is no distension.     Palpations: Abdomen is soft.     Tenderness: There is no abdominal tenderness. There is no rebound.  Musculoskeletal:        General: No tenderness. Normal range of motion.     Cervical back: Normal range of motion and neck supple.  Skin:    General: Skin is warm and dry.     Findings: No abrasion or rash.  Neurological:     Mental Status: She is alert and oriented to person, place, and time.     GCS: GCS eye subscore is 4. GCS verbal subscore is 5. GCS motor subscore is 6.     Cranial Nerves: No cranial nerve deficit.     Sensory: No sensory deficit.  Psychiatric:        Speech: Speech normal.        Behavior: Behavior normal.     ED Results / Procedures / Treatments   Labs (all labs ordered are listed, but only abnormal results are displayed) Labs Reviewed  RESPIRATORY PANEL BY RT PCR (FLU A&B, COVID)  URINE CULTURE  CBC WITH DIFFERENTIAL/PLATELET  BASIC METABOLIC PANEL  URINALYSIS, ROUTINE W REFLEX MICROSCOPIC    EKG EKG Interpretation  Date/Time:  Saturday December 21 2019 09:21:40 EST Ventricular Rate:  60 PR Interval:    QRS Duration: 72 QT Interval:  424 QTC Calculation: 424 R Axis:   2 Text Interpretation: Sinus rhythm Ventricular premature complex Anterior infarct, old No old tracing to compare Confirmed by [id number] (Lorre Nick) on 12/21/2019 9:32:47 AM   Radiology No results found.  Procedures Procedures (including critical care time)  Medications Ordered in ED Medications  0.9 %  sodium chloride infusion (has no administration in time range)    ED Course  I have reviewed the triage vital signs and the nursing notes.  Pertinent labs  & imaging results that were available during my care of the patient were reviewed by me and considered in my medical decision making (see chart for details).    MDM Rules/Calculators/A&P                          Discussed  with Dr. Ronalee Belts from nephrology who will see the patient in consultation.  Patient started on Cardene drip for hypertension.  Will admit to the hospitalist service  CRITICAL CARE Performed by: Toy Baker Total critical care time: 50  minutes Critical care time was exclusive of separately billable procedures and treating other patients. Critical care was necessary to treat or prevent imminent or life-threatening deterioration. Critical care was time spent personally by me on the following activities: development of treatment plan with patient and/or surrogate as well as nursing, discussions with consultants, evaluation of patient's response to treatment, examination of patient, obtaining history from patient or surrogate, ordering and performing treatments and interventions, ordering and review of laboratory studies, ordering and review of radiographic studies, pulse oximetry and re-evaluation of patient's condition.  Final Clinical Impression(s) / ED Diagnoses Final diagnoses:  None    Rx / DC Orders ED Discharge Orders    None       Lorre Nick, MD 12/21/19 3710    Lorre Nick, MD 12/21/19 1116

## 2019-12-21 NOTE — H&P (Signed)
History and Physical    Valerie Bonilla  MPN:[id number]  DOB: [birth year]  DOA: 12/21/2019  PCP: Kelton Pillar, MD Patient coming from: home  Chief Complaint: elevated BP, rising creatinine (told to come to ER)  HPI:  Valerie Bonilla is an 85 yo AA female with PMH resistant HTN, DMII, HLD, CKD (unknown stage), gout who presented after she was told to come for an uprising creatinine outpatient.  She follows with Dr. Candiss Norse and baseline creat is 1.2-1.3. Her creatinine has started rising for unknown reasons; she has also had recent uncontrolled HTN (per note, office SBP 230 mmHg).  She was recommended to present to the ER for BP control, renal biopsy, and further workup/evaluation.   On assessment in the ER her BP was 210s-250s/70s-100s). She states compliance at home with her BP regimen without any missed doses recently (at home on amlodipine, clonidine, doxazosin, toprol).   She was recommended to be placed on nicardipine drip given excessively elevated pressures despite home regimen compliance. Nephrology was consulted and IR with plans for renal biopsy after admission and BP stabilization.   She is accompanied by her son in the ER. She denies any headaches, vision changes, chest pain, shortness of breath, abdominal pain.   I have personally briefly reviewed patient's old medical records in [facility] and discussed patient with the ER provider when appropriate/indicated.  Assessment/Plan: Hypertensive emergency - patient has had difficulty at least recently with BP control (elevated SBP 230 in office recently); patient states compliance with meds - renal function has rapidly deteriorated recently (concern for underlying glomerular disease per neprho, which may be contributed from by severely elevated BP) - hold home meds for now and will resume as able - start on cardene drip; BP on arrival 250/100 - goal SBP 20% reduction within first hour then will pursue ~10-15% more reduction  within next day thereafter  Type 2 diabetes mellitus with other specified complication (HCC) - check A1c - continue SSI and CBGs  Acute on chronic kidney failure (HCC) - CKD stage unknown; no prior values or notes available for review - baseline creat 1.2 - 1.3 per nephrology and creat has been uptrending and is 5.54 on admission -concern is for FSGS or other glomerular disease  - nephrology aware of patient - obtain renal u/s and complete duplex - order placed for renal biopsy and spoke with radiology; they will plan on Monday tentatively; keep asa on hold  - follow up UA: per nephro "if RBC's present in urine sediment, please check ANCA, ANA, anti dsDNA Ab, C3, C4, and anti-GBM" - trend BMP; control BP - strict I&O  Hyperlipidemia - hold statin for now, can resume when more medically stable     Code Status: Full DVT Prophylaxis: SCD Anticipated disposition is to: home  History: Past Medical History:  Diagnosis Date  . Allergic rhinitis   . CKD (chronic kidney disease)   . Diabetes ([place])   . Gout   . Heart murmur   . HTN (hypertension)   . Hypercholesteremia   . Hyperlipidemia   . Medical history non-contributory   . Osteoarthritis   . Osteopenia   . Plantar fasciitis   . Vitamin D deficiency     Past Surgical History:  Procedure Laterality Date  . CESAREAN SECTION    . COLONOSCOPY    . WISDOM TOOTH EXTRACTION       reports that she has never smoked. She has never used smokeless tobacco. No history on file for  alcohol use and drug use.  Allergies  Allergen Reactions  . Ace Inhibitors Cough    Family History  Problem Relation Age of Onset  . Hypertension Sister    Home Medications: Prior to Admission medications   Medication Sig Start Date End Date Taking? Authorizing Provider  amLODipine (NORVASC) 5 MG tablet Take 5 mg by mouth daily. 10/11/19  Yes [provider]  cholecalciferol (VITAMIN D3) 25 MCG (1000 UNIT) tablet Take 1,000 Units by mouth  daily.   Yes [provider]  cloNIDine (CATAPRES) 0.2 MG tablet Take 1 tablet (0.2 mg total) by mouth 2 (two) times daily. Patient taking differently: Take 0.2 mg by mouth daily.  10/04/13  Yes Jettie Booze, MD  doxazosin (CARDURA) 2 MG tablet TAKE 1 TABLET BY MOUTH DAILY. Patient taking differently: Take 2 mg by mouth daily.  04/21/14  Yes Jettie Booze, MD  metoprolol succinate (TOPROL-XL) 100 MG 24 hr tablet Take 100 mg by mouth daily.  01/16/13  Yes [provider]  simvastatin (ZOCOR) 20 MG tablet Take 20 mg by mouth daily.  01/07/13  Yes [provider]  Vitamin D, Ergocalciferol, (DRISDOL) 1.25 MG (50000 UNIT) CAPS capsule Take 50,000 Units by mouth every Monday. 11/18/19  Yes [provider]    Review of Systems:  Pertinent items noted in HPI and remainder of comprehensive ROS otherwise negative.  Physical Exam: Vitals:   12/21/19 1500 12/21/19 1515 12/21/19 1530 12/21/19 1545  BP: (!) 159/66 (!) 150/62 (!) 150/54 (!) 163/67  Pulse: 70 68 65 70  Resp: 14 12 13  (!) 27  Temp:      TempSrc:      SpO2: 100% 97% 98% 96%  Weight:      Height:       General appearance: pleasant elderly woman resting in bed in no distress with son bedside Head: Normocephalic, without obvious abnormality, atraumatic Eyes: EOMI Lungs: clear to auscultation bilaterally Heart: regular rate and rhythm and S1, S2 normal Abdomen: normal findings: bowel sounds normal and soft, non-tender Extremities: no edema Skin: mobility and turgor normal Neurologic: Grossly normal  Labs on Admission:  I have personally reviewed following labs and imaging studies Results for orders placed or performed during the hospital encounter of 12/21/19 (from the past 24 hour(s))  CBC with Differential/Platelet     Status: Abnormal   Collection Time: 12/21/19  9:30 AM  Result Value Ref Range   WBC 5.8 4.0 - 10.5 K/uL   RBC 3.10 (L) 3.87 - 5.11 MIL/uL   Hemoglobin 8.3 (L)  12.0 - 15.0 g/dL   HCT 26.6 (L) 36 - 46 %   MCV 85.8 80.0 - 100.0 fL   MCH 26.8 26.0 - 34.0 pg   MCHC 31.2 30.0 - 36.0 g/dL   RDW 15.1 11.5 - 15.5 %   Platelets 278 150 - 400 K/uL   nRBC 0.0 0.0 - 0.2 %   Neutrophils Relative % 71 %   Neutro Abs 4.2 1.7 - 7.7 K/uL   Lymphocytes Relative 16 %   Lymphs Abs 0.9 0.7 - 4.0 K/uL   Monocytes Relative 5 %   Monocytes Absolute 0.3 0.1 - 1.0 K/uL   Eosinophils Relative 7 %   Eosinophils Absolute 0.4 0.0 - 0.5 K/uL   Basophils Relative 1 %   Basophils Absolute 0.0 0.0 - 0.1 K/uL   Immature Granulocytes 0 %   Abs Immature Granulocytes 0.01 0.00 - 0.07 K/uL  Basic metabolic panel  Status: Abnormal   Collection Time: 12/21/19  9:30 AM  Result Value Ref Range   Sodium 145 135 - 145 mmol/L   Potassium 4.3 3.5 - 5.1 mmol/L   Chloride 113 (H) 98 - 111 mmol/L   CO2 21 (L) 22 - 32 mmol/L   Glucose, Bld 93 70 - 99 mg/dL   BUN 52 (H) 8 - 23 mg/dL   Creatinine, Ser 5.54 (H) 0.44 - 1.00 mg/dL   Calcium 8.5 (L) 8.9 - 10.3 mg/dL   GFR, Estimated 7 (L) >60 mL/min   Anion gap 11 5 - 15  Respiratory Panel by RT PCR (Flu A&B, Covid) - Nasopharyngeal Swab     Status: None   Collection Time: 12/21/19  9:30 AM   Specimen: Nasopharyngeal Swab  Result Value Ref Range   SARS Coronavirus 2 by RT PCR NEGATIVE NEGATIVE   Influenza A by PCR NEGATIVE NEGATIVE   Influenza B by PCR NEGATIVE NEGATIVE  Hemoglobin A1c     Status: None   Collection Time: 12/21/19  9:30 AM  Result Value Ref Range   Hgb A1c MFr Bld 5.0 4.8 - 5.6 %   Mean Plasma Glucose 96.8 mg/dL     Radiological Exams on Admission: No results found. US RENAL    (Results Pending)  US RENAL ARTERY DUPLEX COMPLETE    (Results Pending)  IR Radiologist Eval & Mgmt    (Results Pending)    Consults called:  Nephrology   EKG: Independently reviewed. NSR, PVC noted; QTc 424  Critical care time to evaluate and treat this patient was 35 minutes.  Independent of separate billable services  This  patient is critically ill with the following life-threatening issues requiring my presence at the bedside: Hemodynamic instability requiring titration of medications Cardiac rhythm disturbances requiring evaluation and/or interventions Fluctuations in neurologic function requiring evaluation and/or interventions and/or fluid/volume titration  Dwyane Dee, MD [facility] 12/21/2019, 4:42 PM

## 2019-12-21 NOTE — Assessment & Plan Note (Signed)
-   hold statin for now, can resume when more medically stable

## 2019-12-21 NOTE — Assessment & Plan Note (Addendum)
-   CKD stage unknown; no prior values or notes available for review - baseline creat 1.2 - 1.3 per nephrology and creat has been uptrending and is 5.54 on admission -concern is for FSGS or other glomerular disease  - nephrology aware of patient - obtain renal u/s (small, echogenic kidneys; mild dilated collecting system) and complete duplex (still pending) - order placed for renal biopsy and spoke with radiology; they will plan on Monday tentatively; keep asa on hold  - trend BMP; control BP - strict I&O - follow up nephrology consult

## 2019-12-21 NOTE — Assessment & Plan Note (Addendum)
-   check A1c = 5% - continue SSI and CBGs

## 2019-12-22 ENCOUNTER — Inpatient Hospital Stay (HOSPITAL_COMMUNITY): Payer: Medicare Other

## 2019-12-22 ENCOUNTER — Encounter (HOSPITAL_COMMUNITY): Payer: Self-pay | Admitting: Internal Medicine

## 2019-12-22 DIAGNOSIS — I313 Pericardial effusion (noninflammatory): Secondary | ICD-10-CM | POA: Diagnosis not present

## 2019-12-22 DIAGNOSIS — J90 Pleural effusion, not elsewhere classified: Secondary | ICD-10-CM

## 2019-12-22 DIAGNOSIS — J9601 Acute respiratory failure with hypoxia: Secondary | ICD-10-CM

## 2019-12-22 LAB — GRAM STAIN

## 2019-12-22 LAB — BODY FLUID CELL COUNT WITH DIFFERENTIAL
Eos, Fluid: 1 %
Lymphs, Fluid: 41 %
Monocyte-Macrophage-Serous Fluid: 34 % — ABNORMAL LOW (ref 50–90)
Neutrophil Count, Fluid: 24 % (ref 0–25)
Total Nucleated Cell Count, Fluid: 197 cu mm (ref 0–1000)

## 2019-12-22 LAB — GLUCOSE, CAPILLARY
Glucose-Capillary: 101 mg/dL — ABNORMAL HIGH (ref 70–99)
Glucose-Capillary: 135 mg/dL — ABNORMAL HIGH (ref 70–99)
Glucose-Capillary: 124 mg/dL — ABNORMAL HIGH (ref 70–99)
Glucose-Capillary: 147 mg/dL — ABNORMAL HIGH (ref 70–99)

## 2019-12-22 LAB — LACTATE DEHYDROGENASE: LDH: 224 U/L — ABNORMAL HIGH (ref 98–192)

## 2019-12-22 LAB — PROTEIN, PLEURAL OR PERITONEAL FLUID: Total protein, fluid: <3 g/dL

## 2019-12-22 LAB — ECHOCARDIOGRAM COMPLETE
AR max vel: 1.9 cm2
AV Area VTI: 1.85 cm2
AV Area mean vel: 1.63 cm2
AV Mean grad: 5 mmHg
AV Peak grad: 8.4 mmHg
Ao pk vel: 1.45 m/s
Area-P 1/2: 3.77 cm2
Height: 61 in
S' Lateral: 2.2 cm
Weight: 1792 oz

## 2019-12-22 LAB — RENAL FUNCTION PANEL
Albumin: 2.1 g/dL — ABNORMAL LOW (ref 3.5–5.0)
Anion gap: 9 (ref 5–15)
BUN: 50 mg/dL — ABNORMAL HIGH (ref 8–23)
CO2: 21 mmol/L — ABNORMAL LOW (ref 22–32)
Calcium: 8.1 mg/dL — ABNORMAL LOW (ref 8.9–10.3)
Chloride: 115 mmol/L — ABNORMAL HIGH (ref 98–111)
Creatinine, Ser: 5.48 mg/dL — ABNORMAL HIGH (ref 0.44–1.00)
GFR, Estimated: 7 mL/min — ABNORMAL LOW (ref >60)
Glucose, Bld: 115 mg/dL — ABNORMAL HIGH (ref 70–99)
Phosphorus: 4.7 mg/dL — ABNORMAL HIGH (ref 2.5–4.6)
Potassium: 4.4 mmol/L (ref 3.5–5.1)
Sodium: 145 mmol/L (ref 135–145)

## 2019-12-22 LAB — HEPATIC FUNCTION PANEL
ALT: 11 U/L (ref 0–44)
AST: 15 U/L (ref 15–41)
Albumin: 2 g/dL — ABNORMAL LOW (ref 3.5–5.0)
Alkaline Phosphatase: 34 U/L — ABNORMAL LOW (ref 38–126)
Bilirubin, Direct: <0.1 mg/dL (ref 0.0–0.2)
Total Bilirubin: 0.3 mg/dL (ref 0.3–1.2)
Total Protein: 4.4 g/dL — ABNORMAL LOW (ref 6.5–8.1)

## 2019-12-22 LAB — LACTATE DEHYDROGENASE, PLEURAL OR PERITONEAL FLUID: LD, Fluid: 37 U/L — ABNORMAL HIGH (ref 3–23)

## 2019-12-22 MED ORDER — LIDOCAINE HCL (PF) 1 % IJ SOLN
INTRAMUSCULAR | Status: AC
  Filled 2019-12-22: qty 10

## 2019-12-22 MED ORDER — AMLODIPINE BESYLATE 10 MG PO TABS
10.0000 mg | ORAL_TABLET | Freq: Every day | ORAL | Status: DC
  Administered 2019-12-22 – 2019-12-26 (×5): 10 mg via ORAL
  Filled 2019-12-22 (×5): qty 1

## 2019-12-22 MED ORDER — SODIUM CHLORIDE 0.9 % IV SOLN
250.0000 mg | Freq: Every day | INTRAVENOUS | Status: AC
  Administered 2019-12-22 – 2019-12-23 (×2): 250 mg via INTRAVENOUS
  Filled 2019-12-22 (×2): qty 20

## 2019-12-22 NOTE — Progress Notes (Signed)
PROGRESS NOTE    Valerie Bonilla   WUJ:[id number]  DOB: 1934-03-22  DOA: 12/21/2019     1  PCP: Maurice Small, MD  CC: elevated BP, increasing creatinine   Hospital Course: Ms. Genther is an 85 yo AA female with PMH resistant HTN, DMII, HLD, CKD (unknown stage), gout who presented after she was told to come for an uprising creatinine outpatient.  She follows with Dr. Thedore Mins and baseline creat is 1.2-1.3. Her creatinine has started rising for unknown reasons; she has also had recent uncontrolled HTN (per note, office SBP 230 mmHg).  She was recommended to present to the ER for BP control, renal biopsy, and further workup/evaluation.   On assessment in the ER her BP was 210s-250s/70s-100s). She states compliance at home with her BP regimen without any missed doses recently (at home on amlodipine, clonidine, doxazosin, toprol).   She was recommended to be placed on nicardipine drip given excessively elevated pressures despite home regimen compliance. Nephrology was consulted and IR with plans for renal biopsy after admission and BP stabilization.   She is accompanied by her son in the ER. She denies any headaches, vision changes, chest pain, shortness of breath, abdominal pain.  The morning after admission she was also noted to be hypoxic requiring oxygen overnight.  Her renal ultrasound had noted pleural effusions.  She underwent CXR which showed a left pleural effusion; IR was consulted and patient underwent a thoracentesis on the left which removed 800 cc yellow fluid.    Interval History:  Became short of breath overnight and required oxygen.  CXR shows left-sided pleural effusion this morning for which she will undergo drainage. She is otherwise feeling okay this morning.  Questions were answered regarding upcoming renal biopsy and that we are pursuing further blood pressure control and trying to get her off the Cardene drip.  Old records reviewed in assessment of this  patient  ROS: Constitutional: negative for chills and fevers, Respiratory: positive for SOB, Cardiovascular: negative for chest pain and Gastrointestinal: negative for abdominal pain  Assessment & Plan: Hypertensive emergency - patient has had difficulty at least recently with BP control (elevated SBP 230 in office recently); patient states compliance with meds - renal function has rapidly deteriorated recently (concern for underlying glomerular disease per neprho, which may be contributed from by severely elevated BP) - continue home meds - continue cardene; weaning as able; okay to pursue BP <140/90 - increase home amlodipine to 10 mg  Type 2 diabetes mellitus with other specified complication (HCC) - check A1c = 5% - continue SSI and CBGs  Acute respiratory failure with hypoxia (HCC) -Likely due to left pleural effusion.  Unclear etiology -CXR ordered this morning.  Left pleural effusion noted; possible opacity/infiltrate in right lung base, will monitor clinically for now - thoracentesis ordered and done on 11/14; 800cc fluid removed - follow up pleural studies   Acute on chronic kidney failure (HCC) - CKD stage unknown; no prior values or notes available for review - baseline creat 1.2 - 1.3 per nephrology and creat has been uptrending and is 5.54 on admission -concern is for FSGS or other glomerular disease  - nephrology aware of patient - obtain renal u/s (small, echogenic kidneys; mild dilated collecting system) and complete duplex (still pending) - order placed for renal biopsy and spoke with radiology; they will plan on Monday tentatively; keep asa on hold  - trend BMP; control BP - strict I&O - follow up nephrology consult   Pleural  effusion - see respi failure - follow up pleural studies   Hyperlipidemia - hold statin for now, can resume when more medically stable     Antimicrobials: none  DVT prophylaxis: SCD Code Status: Full Family Communication: none  present Disposition Plan: Status is: Inpatient  Remains inpatient appropriate because:Hemodynamically unstable, Ongoing diagnostic testing needed not appropriate for outpatient work up, Unsafe d/c plan, IV treatments appropriate due to intensity of illness or inability to take PO and Inpatient level of care appropriate due to severity of illness   Dispo: The patient is from: Home              Anticipated d/c is to: Home              Anticipated d/c date is: > 3 days              Patient currently is not medically stable to d/c.       Objective: Blood pressure (!) 153/61, pulse 63, temperature 98.1 F (36.7 C), temperature source Oral, resp. rate 13, height 5\' 1"  (1.549 m), weight 50.8 kg, SpO2 97 %.  Examination: General appearance: alert, cooperative and no distress Head: Normocephalic, without obvious abnormality, atraumatic Eyes: EOMI Lungs: CTABL but decreased on left Heart: regular rate and rhythm and S1, S2 normal Abdomen: normal findings: bowel sounds normal and soft, non-tender Extremities: no edema Skin: mobility and turgor normal Neurologic: Grossly normal  Consultants:   Nephrology  Procedures:   12/22/19: Left thoracentesis   Data Reviewed: I have personally reviewed following labs and imaging studies Results for orders placed or performed during the hospital encounter of 12/21/19 (from the past 24 hour(s))  MRSA PCR Screening     Status: None   Collection Time: 12/21/19  3:48 PM   Specimen: Nasal Mucosa; Nasopharyngeal  Result Value Ref Range   MRSA by PCR NEGATIVE NEGATIVE  Glucose, capillary     Status: None   Collection Time: 12/21/19  4:42 PM  Result Value Ref Range   Glucose-Capillary 72 70 - 99 mg/dL  Iron and TIBC     Status: Abnormal   Collection Time: 12/21/19  4:52 PM  Result Value Ref Range   Iron 24 (L) 28 - 170 ug/dL   TIBC 295209 (L) 284250 - 132450 ug/dL   Saturation Ratios 12 10.4 - 31.8 %   UIBC 185 ug/dL  Ferritin     Status: None    Collection Time: 12/21/19  4:52 PM  Result Value Ref Range   Ferritin 66 11 - 307 ng/mL  Urinalysis, Routine w reflex microscopic Urine, Clean Catch     Status: Abnormal   Collection Time: 12/21/19  8:40 PM  Result Value Ref Range   Color, Urine YELLOW YELLOW   APPearance CLOUDY (A) CLEAR   Specific Gravity, Urine 1.023 1.005 - 1.030   pH 5.0 5.0 - 8.0   Glucose, UA 50 (A) NEGATIVE mg/dL   Hgb urine dipstick SMALL (A) NEGATIVE   Bilirubin Urine NEGATIVE NEGATIVE   Ketones, ur NEGATIVE NEGATIVE mg/dL   Protein, ur >=440>=300 (A) NEGATIVE mg/dL   Nitrite NEGATIVE NEGATIVE   Leukocytes,Ua NEGATIVE NEGATIVE   RBC / HPF 0-5 0 - 5 RBC/hpf   WBC, UA 6-10 0 - 5 WBC/hpf   Bacteria, UA RARE (A) NONE SEEN   Squamous Epithelial / LPF 0-5 0 - 5   Mucus PRESENT    Hyaline Casts, UA PRESENT    Granular Casts, UA PRESENT   Protein / creatinine  ratio, urine     Status: Abnormal   Collection Time: 12/21/19  8:41 PM  Result Value Ref Range   Creatinine, Urine 231.22 mg/dL   Total Protein, Urine 2,134 mg/dL   Protein Creatinine Ratio 9.23 (H) 0.00 - 0.15 mg/mg[Cre]  Glucose, capillary     Status: Abnormal   Collection Time: 12/21/19  9:21 PM  Result Value Ref Range   Glucose-Capillary 188 (H) 70 - 99 mg/dL  Renal function panel     Status: Abnormal   Collection Time: 12/22/19  2:25 AM  Result Value Ref Range   Sodium 145 135 - 145 mmol/L   Potassium 4.4 3.5 - 5.1 mmol/L   Chloride 115 (H) 98 - 111 mmol/L   CO2 21 (L) 22 - 32 mmol/L   Glucose, Bld 115 (H) 70 - 99 mg/dL   BUN 50 (H) 8 - 23 mg/dL   Creatinine, Ser 9.14 (H) 0.44 - 1.00 mg/dL   Calcium 8.1 (L) 8.9 - 10.3 mg/dL   Phosphorus 4.7 (H) 2.5 - 4.6 mg/dL   Albumin 2.1 (L) 3.5 - 5.0 g/dL   GFR, Estimated 7 (L) >60 mL/min   Anion gap 9 5 - 15  Glucose, capillary     Status: Abnormal   Collection Time: 12/22/19  7:05 AM  Result Value Ref Range   Glucose-Capillary 101 (H) 70 - 99 mg/dL  Glucose, capillary     Status: Abnormal    Collection Time: 12/22/19 11:20 AM  Result Value Ref Range   Glucose-Capillary 135 (H) 70 - 99 mg/dL  Gram stain     Status: None   Collection Time: 12/22/19 12:16 PM   Specimen: Peritoneal Washings  Result Value Ref Range   Specimen Description PERITONEAL FLUID    Special Requests NONE    Gram Stain      WBC PRESENT,BOTH PMN AND MONONUCLEAR NO ORGANISMS SEEN CYTOSPIN SMEAR Performed at [facility], [address], [place], [place] [address]    Report Status 12/22/2019 FINAL   Lactate dehydrogenase (pleural or peritoneal fluid)     Status: Abnormal   Collection Time: 12/22/19 12:28 PM  Result Value Ref Range   LD, Fluid 37 (H) 3 - 23 U/L   Fluid Type-FLDH CYTO PERI   Body fluid cell count with differential     Status: Abnormal   Collection Time: 12/22/19 12:28 PM  Result Value Ref Range   Fluid Type-FCT CYTO PERI    Color, Fluid STRAW (A) YELLOW   Appearance, Fluid CLEAR CLEAR   Total Nucleated Cell Count, Fluid 197 0 - 1,000 cu mm   Neutrophil Count, Fluid 24 0 - 25 %   Lymphs, Fluid 41 %   Monocyte-Macrophage-Serous Fluid 34 (L) 50 - 90 %   Eos, Fluid 1 %   Other Cells, Fluid RARE MESOTHELIAL CELL PRESENT. %  Protein, pleural or peritoneal fluid     Status: None   Collection Time: 12/22/19 12:28 PM  Result Value Ref Range   Total protein, fluid <3.0 g/dL   Fluid Type-FTP CYTO PERI     Recent Results (from the past 240 hour(s))  Respiratory Panel by RT PCR (Flu A&B, Covid) - Nasopharyngeal Swab     Status: None   Collection Time: 12/21/19  9:30 AM   Specimen: Nasopharyngeal Swab  Result Value Ref Range Status   SARS Coronavirus 2 by RT PCR NEGATIVE NEGATIVE Final    Comment: (NOTE) SARS-CoV-2 target nucleic acids are NOT DETECTED.  The SARS-CoV-2 RNA  is generally detectable in upper respiratoy specimens during the acute phase of infection. The lowest concentration of SARS-CoV-2 viral copies this assay can detect is 131 copies/mL. A negative result does not  preclude SARS-Cov-2 infection and should not be used as the sole basis for treatment or other patient management decisions. A negative result may occur with  improper specimen collection/handling, submission of specimen other than nasopharyngeal swab, presence of viral mutation(s) within the areas targeted by this assay, and inadequate number of viral copies (<131 copies/mL). A negative result must be combined with clinical observations, patient history, and epidemiological information. The expected result is Negative.  Fact Sheet for Patients:  https://www.moore.com/  Fact Sheet for Healthcare Providers:  https://www.young.biz/  This test is no t yet approved or cleared by the [place] FDA and  has been authorized for detection and/or diagnosis of SARS-CoV-2 by FDA under an Emergency Use Authorization (EUA). This EUA will remain  in effect (meaning this test can be used) for the duration of the COVID-19 declaration under Section 564(b)(1) of the Act, 21 U.S.C. section 360bbb-3(b)(1), unless the authorization is terminated or revoked sooner.     Influenza A by PCR NEGATIVE NEGATIVE Final   Influenza B by PCR NEGATIVE NEGATIVE Final    Comment: (NOTE) The Xpert Xpress SARS-CoV-2/FLU/RSV assay is intended as an aid in  the diagnosis of influenza from Nasopharyngeal swab specimens and  should not be used as a sole basis for treatment. Nasal washings and  aspirates are unacceptable for Xpert Xpress SARS-CoV-2/FLU/RSV  testing.  Fact Sheet for Patients: https://www.moore.com/  Fact Sheet for Healthcare Providers: https://www.young.biz/  This test is not yet approved or cleared by the [place] FDA and  has been authorized for detection and/or diagnosis of SARS-CoV-2 by  FDA under an Emergency Use Authorization (EUA). This EUA will remain  in effect (meaning this test can be used) for the  duration of the  Covid-19 declaration under Section 564(b)(1) of the Act, 21  U.S.C. section 360bbb-3(b)(1), unless the authorization is  terminated or revoked. Performed at [facility], [address], [place], [place] [address]   MRSA PCR Screening     Status: None   Collection Time: 12/21/19  3:48 PM   Specimen: Nasal Mucosa; Nasopharyngeal  Result Value Ref Range Status   MRSA by PCR NEGATIVE NEGATIVE Final    Comment:        The GeneXpert MRSA Assay (FDA approved for NASAL specimens only), is one component of a comprehensive MRSA colonization surveillance program. It is not intended to diagnose MRSA infection nor to guide or monitor treatment for MRSA infections. Performed at [facility], [address], [place], [place] [address]   Gram stain     Status: None   Collection Time: 12/22/19 12:16 PM   Specimen: Peritoneal Washings  Result Value Ref Range Status   Specimen Description PERITONEAL FLUID  Final   Special Requests NONE  Final   Gram Stain   Final    WBC PRESENT,BOTH PMN AND MONONUCLEAR NO ORGANISMS SEEN CYTOSPIN SMEAR Performed at [facility], [address] [place], [place] [address]    Report Status 12/22/2019 FINAL  Final     Radiology Studies: DG Chest 1 View  Result Date: 12/22/2019 CLINICAL DATA:  Status post left thoracentesis. EXAM: CHEST  1 VIEW COMPARISON:  December 22, 2019 FINDINGS: Enlarged cardiac silhouette. Decreased left pleural effusion. Mild streaky airspace opacities in the left lung base. No radiographically  apparent pneumothorax. Osseous structures are without acute abnormality. Soft tissues are grossly normal. IMPRESSION: 1. No radiographically apparent pneumothorax. 2. Decreased left pleural effusion. 3. Mild streaky airspace opacities in the left lung base. Electronically Signed   By: Ted Mcalpine M.D.   On: 12/22/2019 13:29   US RENAL  Result Date: 12/21/2019 CLINICAL DATA:  Acute kidney injury EXAM:  RENAL / URINARY TRACT ULTRASOUND COMPLETE COMPARISON:  None. FINDINGS: Right Kidney: Renal measurements: 7.2 x 3.4 x 3.8 cm = volume: 48 mL. There is increased cortical echogenicity. There appears to be mild collecting system dilatation. Left Kidney: Renal measurements: 7.7 x 4.4 x 3.7 cm = volume: 66 mL. There is increased cortical echogenicity with mild collecting system dilatation. Bladder: The ureteral jets were not visualized. Other: Incidentally noted are bilateral pleural effusions that appear to be at least moderate if not large in size. IMPRESSION: 1. Small, echogenic kidneys bilaterally which can be seen in patients with medical renal disease. 2. Mild bilateral collecting system dilatation. 3. The ureteral jets were not visualized. 4. Incidentally noted moderate if not large bilateral pleural effusions. Electronically Signed   By: Katherine Mantle M.D.   On: 12/21/2019 19:05   DG CHEST PORT 1 VIEW  Result Date: 12/22/2019 CLINICAL DATA:  Pleural effusion EXAM: PORTABLE CHEST 1 VIEW COMPARISON:  None. FINDINGS: There is a fairly small left pleural effusion with left base atelectasis. There is a subtle area of ill-defined opacity in the right base. Lungs otherwise are clear. Heart is mildly enlarged with pulmonary vascularity normal. No adenopathy. No bone lesions. IMPRESSION: Small left pleural effusion with left base atelectasis. Focal area of opacity right base concerning for developing pneumonia. Mild cardiomegaly. No adenopathy. Electronically Signed   By: Bretta Bang III M.D.   On: 12/22/2019 11:21   ECHOCARDIOGRAM COMPLETE  Result Date: 12/22/2019    ECHOCARDIOGRAM REPORT   Patient Name:   ALLA SLOMA Date of Exam: 12/22/2019 Medical Rec #:  [id number]       Height:       61.0 in Accession #:    [id number]      Weight:       112.0 lb Date of Birth:  08-09-1934       BSA:          1.476 m Patient Age:    85 years        BP:           153/61 mmHg Patient Gender: F               HR:            63 bpm. Exam Location:  Inpatient Procedure: 2D Echo, Cardiac Doppler and Color Doppler Indications:    Pleural effusion  History:        Patient has prior history of Echocardiogram examinations, most                 recent 03/16/2010. Risk Factors:Hypertension, Diabetes and                 Dyslipidemia. CKD.  Sonographer:    Ross Ludwig RDCS (AE) Referring Phys: [phone] Rylen Swindler IMPRESSIONS  1. Left ventricular ejection fraction, by estimation, is 60 to 65%. The left ventricle has normal function. The left ventricle has no regional wall motion abnormalities. There is severe concentric left ventricular hypertrophy. Left ventricular diastolic  parameters are consistent with Grade II diastolic dysfunction (pseudonormalization). Elevated left ventricular end-diastolic pressure.  2. Right  ventricular systolic function is normal. The right ventricular size is normal. There is mildly elevated pulmonary artery systolic pressure. The estimated right ventricular systolic pressure is 37.8 mmHg.  3. Left atrial size was severely dilated.  4. A small pericardial effusion is present. The pericardial effusion is circumferential.  5. The mitral valve is degenerative. Mild mitral valve regurgitation. No evidence of mitral stenosis.  6. The aortic valve is tricuspid. Aortic valve regurgitation is trivial. Mild aortic valve sclerosis is present, with no evidence of aortic valve stenosis.  7. The inferior vena cava is dilated in size with >50% respiratory variability, suggesting right atrial pressure of 8 mmHg.  8. The LV myocardium is severly thickened with a speckeled pattern. In the setting of pericardial effusion and severe LA enlargement, would consider possible amyloidosis. Consider PYP scan or cardiac MRI if clinically indicated. FINDINGS  Left Ventricle: Left ventricular ejection fraction, by estimation, is 60 to 65%. The left ventricle has normal function. The left ventricle has no regional wall motion abnormalities.  The left ventricular internal cavity size was normal in size. There is  severe concentric left ventricular hypertrophy. Left ventricular diastolic parameters are consistent with Grade II diastolic dysfunction (pseudonormalization). Elevated left ventricular end-diastolic pressure. Right Ventricle: The right ventricular size is normal. No increase in right ventricular wall thickness. Right ventricular systolic function is normal. There is mildly elevated pulmonary artery systolic pressure. The tricuspid regurgitant velocity is 2.73  m/s, and with an assumed right atrial pressure of 8 mmHg, the estimated right ventricular systolic pressure is 37.8 mmHg. Left Atrium: Left atrial size was severely dilated. Right Atrium: Right atrial size was normal in size. Pericardium: A small pericardial effusion is present. The pericardial effusion is circumferential. Mitral Valve: The mitral valve is degenerative in appearance. There is mild thickening of the mitral valve leaflet(s). Mild mitral annular calcification. Mild mitral valve regurgitation. No evidence of mitral valve stenosis. MV peak gradient, 6.4 mmHg. The mean mitral valve gradient is 2.0 mmHg. Tricuspid Valve: The tricuspid valve is normal in structure. Tricuspid valve regurgitation is trivial. No evidence of tricuspid stenosis. Aortic Valve: The aortic valve is tricuspid. Aortic valve regurgitation is trivial. Mild aortic valve sclerosis is present, with no evidence of aortic valve stenosis. Aortic valve mean gradient measures 5.0 mmHg. Aortic valve peak gradient measures 8.4 mmHg. Aortic valve area, by VTI measures 1.85 cm. Pulmonic Valve: The pulmonic valve was normal in structure. Pulmonic valve regurgitation is not visualized. No evidence of pulmonic stenosis. Aorta: The aortic root is normal in size and structure. Venous: The inferior vena cava is dilated in size with greater than 50% respiratory variability, suggesting right atrial pressure of 8 mmHg.  IAS/Shunts: No atrial level shunt detected by color flow Doppler.  LEFT VENTRICLE PLAX 2D LVIDd:         4.20 cm  Diastology LVIDs:         2.20 cm  LV e' medial:    6.08 cm/s LV PW:         1.60 cm  LV E/e' medial:  17.4 LV IVS:        1.80 cm  LV e' lateral:   6.83 cm/s LVOT diam:     1.60 cm  LV E/e' lateral: 15.5 LV SV:         73 LV SV Index:   49 LVOT Area:     2.01 cm  RIGHT VENTRICLE            IVC RV Basal  diam:  3.00 cm    IVC diam: 2.10 cm RV S prime:     9.30 cm/s TAPSE (M-mode): 2.3 cm LEFT ATRIUM              Index       RIGHT ATRIUM           Index LA diam:        3.60 cm  2.44 cm/m  RA Area:     15.10 cm LA Vol (A2C):   96.4 ml  65.30 ml/m RA Volume:   36.20 ml  24.52 ml/m LA Vol (A4C):   102.0 ml 69.09 ml/m LA Biplane Vol: 103.0 ml 69.77 ml/m  AORTIC VALVE AV Area (Vmax):    1.90 cm AV Area (Vmean):   1.63 cm AV Area (VTI):     1.85 cm AV Vmax:           145.00 cm/s AV Vmean:          107.000 cm/s AV VTI:            0.393 m AV Peak Grad:      8.4 mmHg AV Mean Grad:      5.0 mmHg LVOT Vmax:         137.00 cm/s LVOT Vmean:        86.600 cm/s LVOT VTI:          0.361 m LVOT/AV VTI ratio: 0.92  AORTA Ao Root diam: 2.80 cm Ao Asc diam:  2.00 cm MITRAL VALVE                TRICUSPID VALVE MV Area (PHT): 3.77 cm     TR Peak grad:   29.8 mmHg MV Peak grad:  6.4 mmHg     TR Vmax:        273.00 cm/s MV Mean grad:  2.0 mmHg MV Vmax:       1.26 m/s     SHUNTS MV Vmean:      60.6 cm/s    Systemic VTI:  0.36 m MV Decel Time: 201 msec     Systemic Diam: 1.60 cm MV E velocity: 106.00 cm/s MV A velocity: 53.60 cm/s MV E/A ratio:  1.98 Armanda Magic MD Electronically signed by Armanda Magic MD Signature Date/Time: 12/22/2019/1:37:09 PM    Final    US THORACENTESIS ASP PLEURAL SPACE W/IMG GUIDE  Result Date: 12/22/2019 INDICATION: Progressive renal failure with fluid overload and bilateral pleural effusions. Request for diagnostic and therapeutic thoracentesis. EXAM: ULTRASOUND GUIDED LEFT THORACENTESIS  MEDICATIONS: 1% lidocaine 10 mL COMPLICATIONS: None immediate. PROCEDURE: An ultrasound guided thoracentesis was thoroughly discussed with the patient and questions answered. The benefits, risks, alternatives and complications were also discussed. The patient understands and wishes to proceed with the procedure. Written consent was obtained. Ultrasound was performed to localize and mark an adequate pocket of fluid in the left chest. The area was then prepped and draped in the normal sterile fashion. 1% Lidocaine was used for local anesthesia. Under ultrasound guidance a 6 Fr Safe-T-Centesis catheter was introduced. Thoracentesis was performed. The catheter was removed and a dressing applied. FINDINGS: A total of approximately 800 mL of clear yellow fluid was removed. Samples were sent to the laboratory as requested by the clinical team. IMPRESSION: Successful ultrasound guided left thoracentesis yielding 800 mL of pleural fluid. Read by: Corrin Parker, PA-C Electronically Signed   By: Irish Lack M.D.   On: 12/22/2019 12:38   DG Chest 1 View  Final  Result    US THORACENTESIS ASP PLEURAL SPACE W/IMG GUIDE  Final Result    DG CHEST PORT 1 VIEW  Final Result    US RENAL  Final Result    US RENAL ARTERY DUPLEX COMPLETE    (Results Pending)  US BIOPSY (KIDNEY)    (Results Pending)    Scheduled Meds: . amLODipine  10 mg Oral Daily  . Chlorhexidine Gluconate Cloth  6 each Topical Daily  . cloNIDine  0.2 mg Oral BID  . doxazosin  2 mg Oral Daily  . insulin aspart  0-5 Units Subcutaneous QHS  . insulin aspart  0-6 Units Subcutaneous TID WC  . lidocaine (PF)      . sodium chloride flush  3 mL Intravenous Q12H   PRN Meds: acetaminophen **OR** acetaminophen Continuous Infusions: . sodium chloride 10 mL/hr at 12/22/19 0700  . ferric gluconate (FERRLECIT/NULECIT) IV    . niCARDipine 3 mg/hr (12/22/19 0700)     LOS: 1 day  Time spent: Greater than 50% of the 35 minute visit was spent in  counseling/coordination of care for the patient as laid out in the A&P.   Lewie Chamber, MD [facility] 12/22/2019, 3:15 PM

## 2019-12-22 NOTE — Progress Notes (Addendum)
[facility] NEPHROLOGY PROGRESS NOTE  Assessment/ Plan: Pt is a 85 y.o. yo female  with history of longstanding uncontrolled Bonilla, Valerie Bonilla, Valerie Bonilla, Valerie Bonilla, Valerie Bonilla, Valerie Bonilla at [facility].  #Acute kidney injury on Valerie probably because of underlying glomerulonephritis and hypertensive urgency:  Urinalysis with nephrotic range proteinuria without hematuria. Kidney ultrasound with echogenic kidneys however incidental finding of pleural effusion. Planning for kidney biopsy. Monitor BMP, strict ins and out, avoid nephrotoxins.  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 Around 10 g of proteinuria.  Secondary work-up including SPEP, kappa lambda ratio, hep B, hep C, HIV unremarkable.  CPK only elevated to 467. Repeat UA with no RBC, 9.2 g of protein. Pending ANA, ANCA, C3, C4, anti-GBM. Consulted IR for kidney biopsy probably on Monday.  Needs better blood pressure control.  Continue to hold aspirin.  #Hypertensive urgency: Amlodipine dose increased to 10 mg.  Continue doxazosin and clonidine.  Try to wean Cardene drip gradually.    #Anemia: Iron saturation 12% therefore I will order IV iron.  Monitor hemoglobin.  Further evaluation including stool occult blood test etc. defer to primary team.  #Bilateral moderate to large pleural effusion Valerie on kidney ultrasound: She is requiring oxygen this morning.  Some shortness of breath.  I have discussed this finding with primary team.  Getting x-ray and possible thoracocentesis by IR.  I have discussed with the patient and her son.  Subjective: Valerie and examined.  Patient requires some oxygen.  Mild shortness of breath.  Denies nausea vomiting headache dizziness.  Urine output is not recorded. Objective Vital  signs in last 24 hours: Vitals:   12/22/19 0500 12/22/19 0600 12/22/19 0700 12/22/19 0800  BP: (!) 147/54 (!) 149/46 (!) 147/61   Pulse: 63 66 63   Resp: 11 14 13    Temp:    98.1 F (36.7 C)  TempSrc:    Oral  SpO2: 100% 97% 97%   Weight:      Height:       Weight change:   Intake/Output Summary (Last 24 hours) at 12/22/2019 1048 Last data filed at 12/22/2019 1047 Gross per 24 hour  Intake 1068.48 ml  Output --  Net 1068.48 ml       Labs: Basic Metabolic Panel: Recent Labs  Lab 12/21/19 0930 12/22/19 0225  NA 145 145  K 4.3 4.4  CL 113* 115*  CO2 21* 21*  GLUCOSE 93 115*  BUN 52* 50*  CREATININE 5.54* 5.48*  CALCIUM 8.5* 8.1*  PHOS  --  4.7*   Liver Function Tests: Recent Labs  Lab 12/22/19 0225  ALBUMIN 2.1*   No results for input(s): LIPASE, AMYLASE in the last 168 hours. No results for input(s): AMMONIA in the last 168 hours. CBC: Recent Labs  Lab 12/21/19 0930  WBC 5.8  NEUTROABS 4.2  HGB 8.3*  HCT 26.6*  MCV 85.8  PLT 278   Cardiac Enzymes: No results for input(s): CKTOTAL, CKMB, CKMBINDEX, TROPONINI in the last 168 hours. CBG: Recent Labs  Lab 12/21/19 1642 12/21/19 2121 12/22/19 0705  GLUCAP 72 188* 101*    Iron Studies:  Recent Labs    12/21/19 1652  IRON 24*  TIBC 209*  FERRITIN 66   Studies/Results: US RENAL  Result Date: 12/21/2019 CLINICAL DATA:  Acute kidney injury EXAM: RENAL /  URINARY TRACT ULTRASOUND COMPLETE COMPARISON:  None. FINDINGS: Right Kidney: Renal measurements: 7.2 x 3.4 x 3.8 cm = volume: 48 mL. There is increased cortical echogenicity. There appears to be mild collecting system dilatation. Left Kidney: Renal measurements: 7.7 x 4.4 x 3.7 cm = volume: 66 mL. There is increased cortical echogenicity with mild collecting system dilatation. Bladder: The ureteral jets were not visualized. Other: Incidentally noted are bilateral pleural effusions that appear to be at least moderate if not large in size.  IMPRESSION: 1. Small, echogenic kidneys bilaterally which can be Valerie in patients with medical renal disease. 2. Mild bilateral collecting system dilatation. 3. The ureteral jets were not visualized. 4. Incidentally noted moderate if not large bilateral pleural effusions. Electronically Signed   By: Constance Holster M.D.   On: 12/21/2019 19:05    Medications: Infusions: . sodium chloride 10 mL/hr at 12/22/19 0700  . niCARDipine 3 mg/hr (12/22/19 0700)    Scheduled Medications: . amLODipine  10 mg Oral Daily  . Chlorhexidine Gluconate Cloth  6 each Topical Daily  . cloNIDine  0.2 mg Oral BID  . doxazosin  2 mg Oral Daily  . insulin aspart  0-5 Units Subcutaneous QHS  . insulin aspart  0-6 Units Subcutaneous TID WC  . sodium chloride flush  3 mL Intravenous Q12H    have reviewed scheduled and prn medications.  Physical Exam: General:NAD, comfortable Heart:RRR, s1s2 nl, no rubs Lungs: Basal decreased breath sound, no increased work of breathing Abdomen:soft, Non-tender, non-distended Extremities:No edema Neurology: Alert awake and following commands.  Trudy Kory Prasad Okley Magnussen 12/22/2019,10:48 AM  LOS: 1 day  Pager: [phone]

## 2019-12-22 NOTE — Progress Notes (Signed)
  Echocardiogram 2D Echocardiogram has been performed.  Gerda Diss 12/22/2019, 1:28 PM

## 2019-12-22 NOTE — Assessment & Plan Note (Addendum)
-   see respi failure - follow up pleural studies  - obtain echo

## 2019-12-22 NOTE — Procedures (Signed)
PROCEDURE SUMMARY:  Successful US guided left thoracentesis. Yielded 800 mL of clear yellow fluid. Patient tolerated procedure well. No immediate complications. EBL = trace  Specimen was sent for labs.  Post procedure chest X-ray pending.  Gwynneth Macleod PA-C 12/22/2019 12:39 PM

## 2019-12-22 NOTE — Assessment & Plan Note (Addendum)
-  Likely due to left pleural effusion.  Unclear etiology, possibly CHF -CXR ordered 11/14.  Left pleural effusion noted; possible opacity/infiltrate in right lung base - thoracentesis ordered and done on 11/14; 800cc fluid removed - follow up pleural studies; negative for signs of infection - wean O2 as able

## 2019-12-22 NOTE — H&P (Signed)
Chief Complaint: Renal failure  Referring Physician(s): Ronalee Belts  Supervising Physician: Irish Lack  Patient Status: [facility] - In-pt  History of Present Illness: Valerie Bonilla is a 85 y.o. female who was sent to the ED from her nephrologists office for rising creatinine.  She has medical history of hypertension which is poorly controlled, diabetes type 2 and CKD.  Her baseline creatinine is usually around 1.3 however on admission it was 5.4.  We are asked to perform a random renal biopsy.   Past Medical History:  Diagnosis Date  . Allergic rhinitis   . CKD (chronic kidney disease)   . Diabetes (HCC)   . Gout   . Heart murmur   . HTN (hypertension)   . Hypercholesteremia   . Hyperlipidemia   . Medical history non-contributory   . Osteoarthritis   . Osteopenia   . Plantar fasciitis   . Vitamin D deficiency     Past Surgical History:  Procedure Laterality Date  . CESAREAN SECTION    . COLONOSCOPY    . WISDOM TOOTH EXTRACTION      Allergies: Ace inhibitors  Medications: Prior to Admission medications   Medication Sig Start Date End Date Taking? Authorizing Provider  amLODipine (NORVASC) 5 MG tablet Take 5 mg by mouth daily. 10/11/19  Yes [provider]  cholecalciferol (VITAMIN D3) 25 MCG (1000 UNIT) tablet Take 1,000 Units by mouth daily.   Yes [provider]  cloNIDine (CATAPRES) 0.2 MG tablet Take 1 tablet (0.2 mg total) by mouth 2 (two) times daily. Patient taking differently: Take 0.2 mg by mouth daily.  10/04/13  Yes Corky Crafts, MD  doxazosin (CARDURA) 2 MG tablet TAKE 1 TABLET BY MOUTH DAILY. Patient taking differently: Take 2 mg by mouth daily.  04/21/14  Yes Corky Crafts, MD  metoprolol succinate (TOPROL-XL) 100 MG 24 hr tablet Take 100 mg by mouth daily.  01/16/13  Yes [provider]  simvastatin (ZOCOR) 20 MG tablet Take 20 mg by mouth daily.  01/07/13  Yes [provider]  Vitamin D,  Ergocalciferol, (DRISDOL) 1.25 MG (50000 UNIT) CAPS capsule Take 50,000 Units by mouth every Monday. 11/18/19  Yes [provider]     Family History  Problem Relation Age of Onset  . Hypertension Sister     Social History   Socioeconomic History  . Marital status: Divorced    Spouse name: Not on file  . Number of children: Not on file  . Years of education: Not on file  . Highest education level: Not on file  Occupational History  . Not on file  Tobacco Use  . Smoking status: Never Smoker  . Smokeless tobacco: Never Used  Substance and Sexual Activity  . Alcohol use: Not on file  . Drug use: Not on file  . Sexual activity: Not on file  Other Topics Concern  . Not on file  Social History Narrative  . Not on file   Social Determinants of Health   Financial Resource Strain:   . Difficulty of Paying Living Expenses: Not on file  Food Insecurity:   . Worried About Programme researcher, broadcasting/film/video in the Last Year: Not on file  . Ran Out of Food in the Last Year: Not on file  Transportation Needs:   . Lack of Transportation (Medical): Not on file  . Lack of Transportation (Non-Medical): Not on file  Physical Activity:   . Days of Exercise per Week: Not on file  .  Minutes of Exercise per Session: Not on file  Stress:   . Feeling of Stress : Not on file  Social Connections:   . Frequency of Communication with Friends and Family: Not on file  . Frequency of Social Gatherings with Friends and Family: Not on file  . Attends Religious Services: Not on file  . Active Member of Clubs or Organizations: Not on file  . Attends Banker Meetings: Not on file  . Marital Status: Not on file     Review of Systems: A 12 point ROS discussed and pertinent positives are indicated in the HPI above.  All other systems are negative.  Review of Systems  Vital Signs: BP (!) 147/61   Pulse 63   Temp 98.1 F (36.7 C) (Oral)   Resp 13   Ht 5\' 1"  (1.549 m)   Wt 50.8 kg    SpO2 97%   BMI 21.16 kg/m   Physical Exam Vitals reviewed.  Constitutional:      Appearance: Normal appearance.  HENT:     Head: Normocephalic and atraumatic.  Eyes:     Extraocular Movements: Extraocular movements intact.  Cardiovascular:     Rate and Rhythm: Normal rate and regular rhythm.  Pulmonary:     Effort: Pulmonary effort is normal. No respiratory distress.     Breath sounds: Normal breath sounds.  Abdominal:     General: There is no distension.     Palpations: Abdomen is soft.     Tenderness: There is no abdominal tenderness.  Musculoskeletal:        General: Normal range of motion.  Skin:    General: Skin is warm and dry.  Neurological:     General: No focal deficit present.     Mental Status: She is alert and oriented to person, place, and time.  Psychiatric:        Mood and Affect: Mood normal.        Behavior: Behavior normal.        Thought Content: Thought content normal.        Judgment: Judgment normal.     Imaging: RENAL  Result Date: 12/21/2019 CLINICAL DATA:  Acute kidney injury EXAM: RENAL / URINARY TRACT ULTRASOUND COMPLETE COMPARISON:  None. FINDINGS: Right Kidney: Renal measurements: 7.2 x 3.4 x 3.8 cm = volume: 48 mL. There is increased cortical echogenicity. There appears to be mild collecting system dilatation. Left Kidney: Renal measurements: 7.7 x 4.4 x 3.7 cm = volume: 66 mL. There is increased cortical echogenicity with mild collecting system dilatation. Bladder: The ureteral jets were not visualized. Other: Incidentally noted are bilateral pleural effusions that appear to be at least moderate if not large in size. IMPRESSION: 1. Small, echogenic kidneys bilaterally which can be seen in patients with medical renal disease. 2. Mild bilateral collecting system dilatation. 3. The ureteral jets were not visualized. 4. Incidentally noted moderate if not large bilateral pleural effusions. Electronically Signed   By: 12/23/2019 M.D.   On:  12/21/2019 19:05   DG CHEST PORT 1 VIEW  Result Date: 12/22/2019 CLINICAL DATA:  Pleural effusion EXAM: PORTABLE CHEST 1 VIEW COMPARISON:  None. FINDINGS: There is a fairly small left pleural effusion with left base atelectasis. There is a subtle area of ill-defined opacity in the right base. Lungs otherwise are clear. Heart is mildly enlarged with pulmonary vascularity normal. No adenopathy. No bone lesions. IMPRESSION: Small left pleural effusion with left base atelectasis. Focal area of opacity right  base concerning for developing pneumonia. Mild cardiomegaly. No adenopathy. Electronically Signed   By: Bretta Bang III M.D.   On: 12/22/2019 11:21    Labs:  CBC: Recent Labs    12/21/19 0930  WBC 5.8  HGB 8.3*  HCT 26.6*  PLT 278    COAGS: No results for input(s): INR, APTT in the last 8760 hours.  BMP: Recent Labs    12/21/19 0930 12/22/19 0225  NA 145 145  K 4.3 4.4  CL 113* 115*  CO2 21* 21*  GLUCOSE 93 115*  BUN 52* 50*  CALCIUM 8.5* 8.1*  CREATININE 5.54* 5.48*  GFRNONAA 7* 7*    LIVER FUNCTION TESTS: Recent Labs    12/22/19 0225  ALBUMIN 2.1*    TUMOR MARKERS: No results for input(s): AFPTM, CEA, CA199, CHROMGRNA in the last 8760 hours.  Assessment and Plan:  Progressive kidney disease.  Will proceed with random renal biopsy as IR schedule allows, hopefully tomorrow.  Risks and benefits of random renal biopsy was discussed with the patient and/or patient's family including, but not limited to bleeding, infection, damage to adjacent structures or low yield requiring additional tests.  All of the questions were answered and there is agreement to proceed.  Consent signed and in chart.  Thank you for this interesting consult.  I greatly enjoyed meeting Valerie Bonilla and look forward to participating in their care.  A copy of this report was sent to the requesting provider on this date.  Electronically Signed: Gwynneth Macleod, PA-C   12/22/2019,  11:35 AM      I spent a total of 20 Minutes in face to face in clinical consultation, greater than 50% of which was counseling/coordinating care for random renal biopsy.

## 2019-12-23 ENCOUNTER — Encounter (HOSPITAL_COMMUNITY): Payer: Self-pay | Admitting: Internal Medicine

## 2019-12-23 ENCOUNTER — Inpatient Hospital Stay (HOSPITAL_COMMUNITY): Payer: Medicare Other

## 2019-12-23 DIAGNOSIS — I160 Hypertensive urgency: Secondary | ICD-10-CM

## 2019-12-23 DIAGNOSIS — D509 Iron deficiency anemia, unspecified: Secondary | ICD-10-CM

## 2019-12-23 DIAGNOSIS — I161 Hypertensive emergency: Principal | ICD-10-CM

## 2019-12-23 DIAGNOSIS — I43 Cardiomyopathy in diseases classified elsewhere: Secondary | ICD-10-CM

## 2019-12-23 DIAGNOSIS — E854 Organ-limited amyloidosis: Secondary | ICD-10-CM

## 2019-12-23 DIAGNOSIS — N179 Acute kidney failure, unspecified: Secondary | ICD-10-CM | POA: Diagnosis not present

## 2019-12-23 DIAGNOSIS — I5031 Acute diastolic (congestive) heart failure: Secondary | ICD-10-CM | POA: Diagnosis not present

## 2019-12-23 HISTORY — DX: Acute diastolic (congestive) heart failure: I50.31

## 2019-12-23 LAB — CBC
HCT: 26.5 % — ABNORMAL LOW (ref 36.0–46.0)
Hemoglobin: 8.7 g/dL — ABNORMAL LOW (ref 12.0–15.0)
MCH: 27.8 pg (ref 26.0–34.0)
MCHC: 32.8 g/dL (ref 30.0–36.0)
MCV: 84.7 fL (ref 80.0–100.0)
Platelets: 248 10*3/uL (ref 150–400)
RBC: 3.13 MIL/uL — ABNORMAL LOW (ref 3.87–5.11)
RDW: 14.8 % (ref 11.5–15.5)
WBC: 12.7 10*3/uL — ABNORMAL HIGH (ref 4.0–10.5)
nRBC: 0 % (ref 0.0–0.2)

## 2019-12-23 LAB — GLUCOSE, CAPILLARY
Glucose-Capillary: 87 mg/dL (ref 70–99)
Glucose-Capillary: 95 mg/dL (ref 70–99)
Glucose-Capillary: 93 mg/dL (ref 70–99)
Glucose-Capillary: 145 mg/dL — ABNORMAL HIGH (ref 70–99)

## 2019-12-23 LAB — CBC WITH DIFFERENTIAL/PLATELET
Abs Immature Granulocytes: 0.04 10*3/uL (ref 0.00–0.07)
Basophils Absolute: 0 10*3/uL (ref 0.0–0.1)
Basophils Relative: 0 %
Eosinophils Absolute: 0.4 10*3/uL (ref 0.0–0.5)
Eosinophils Relative: 5 %
HCT: 19.5 % — ABNORMAL LOW (ref 36.0–46.0)
Hemoglobin: 6.2 g/dL — CL (ref 12.0–15.0)
Immature Granulocytes: 1 %
Lymphocytes Relative: 11 %
Lymphs Abs: 1 10*3/uL (ref 0.7–4.0)
MCH: 27.2 pg (ref 26.0–34.0)
MCHC: 31.8 g/dL (ref 30.0–36.0)
MCV: 85.5 fL (ref 80.0–100.0)
Monocytes Absolute: 0.6 10*3/uL (ref 0.1–1.0)
Monocytes Relative: 7 %
Neutro Abs: 6.6 10*3/uL (ref 1.7–7.7)
Neutrophils Relative %: 76 %
Platelets: 248 10*3/uL (ref 150–400)
RBC: 2.28 MIL/uL — ABNORMAL LOW (ref 3.87–5.11)
RDW: 15.2 % (ref 11.5–15.5)
WBC: 8.6 10*3/uL (ref 4.0–10.5)
nRBC: 0 % (ref 0.0–0.2)

## 2019-12-23 LAB — ANCA TITERS
Atypical P-ANCA titer: <1:20 {titer}
C-ANCA: <1:20 {titer}
P-ANCA: <1:20 {titer}

## 2019-12-23 LAB — BASIC METABOLIC PANEL
Anion gap: 8 (ref 5–15)
BUN: 53 mg/dL — ABNORMAL HIGH (ref 8–23)
CO2: 20 mmol/L — ABNORMAL LOW (ref 22–32)
Calcium: 8 mg/dL — ABNORMAL LOW (ref 8.9–10.3)
Chloride: 115 mmol/L — ABNORMAL HIGH (ref 98–111)
Creatinine, Ser: 5.81 mg/dL — ABNORMAL HIGH (ref 0.44–1.00)
GFR, Estimated: 7 mL/min — ABNORMAL LOW (ref >60)
Glucose, Bld: 102 mg/dL — ABNORMAL HIGH (ref 70–99)
Potassium: 4.7 mmol/L (ref 3.5–5.1)
Sodium: 143 mmol/L (ref 135–145)

## 2019-12-23 LAB — C4 COMPLEMENT: Complement C4, Body Fluid: 27 mg/dL (ref 12–38)

## 2019-12-23 LAB — PREPARE RBC (CROSSMATCH)

## 2019-12-23 LAB — MAGNESIUM: Magnesium: 1.6 mg/dL — ABNORMAL LOW (ref 1.7–2.4)

## 2019-12-23 LAB — C3 COMPLEMENT: C3 Complement: 98 mg/dL (ref 82–167)

## 2019-12-23 LAB — ANA W/REFLEX IF POSITIVE: Anti Nuclear Antibody (ANA): NEGATIVE

## 2019-12-23 LAB — PHOSPHORUS: Phosphorus: 4.6 mg/dL (ref 2.5–4.6)

## 2019-12-23 LAB — ABO/RH: ABO/RH(D): O POS

## 2019-12-23 LAB — BRAIN NATRIURETIC PEPTIDE: B Natriuretic Peptide: 523.9 pg/mL — ABNORMAL HIGH (ref 0.0–100.0)

## 2019-12-23 MED ORDER — HYDRALAZINE HCL 25 MG PO TABS
25.0000 mg | ORAL_TABLET | Freq: Three times a day (TID) | ORAL | Status: DC
  Administered 2019-12-23 (×3): 25 mg via ORAL
  Filled 2019-12-23 (×3): qty 1

## 2019-12-23 MED ORDER — IPRATROPIUM-ALBUTEROL 0.5-2.5 (3) MG/3ML IN SOLN
3.0000 mL | RESPIRATORY_TRACT | Status: DC | PRN
  Administered 2019-12-24 – 2019-12-25 (×3): 3 mL via RESPIRATORY_TRACT
  Filled 2019-12-23 (×3): qty 3

## 2019-12-23 MED ORDER — FUROSEMIDE 10 MG/ML IJ SOLN
40.0000 mg | Freq: Once | INTRAMUSCULAR | Status: AC
  Administered 2019-12-23: 40 mg via INTRAVENOUS
  Filled 2019-12-23: qty 4

## 2019-12-23 MED ORDER — SODIUM CHLORIDE 0.9% IV SOLUTION: Freq: Once | INTRAVENOUS | Status: AC

## 2019-12-23 NOTE — Progress Notes (Addendum)
[facility] ROUNDING NOTE   Subjective:   Brief history This is an 85 year old lady with a history of hypertension diabetes mellitus type 2 hyperlipidemia chronic kidney disease gout.  Baseline serum creatinine appears to be about 1.2 to 1.3 mg/dL.  She has recurrent uncontrolled hypertension.  She is followed by Dr. Candiss Norse at [facility].  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  Secondary work-up including SPEP hepatitis B kappa lambda light chain ratio hepatitis C and HIV are unremarkable.  CPK elevated 467. She is to undergo renal biopsy 12/23/2019  Blood pressure 149/57 pulse 71 temperature 99.1 O2 sats 97% room air.  No recorded urine output  Most recent labs pending 12/23/2019 creatinine 5.48 12/22/2019 potassium 4.4 CO2 21 sodium 145 calcium 8.1 Hemoglobin 6.2 noted  Objective:  Vital signs in last 24 hours:  Temp:  [98.1 F (36.7 C)-99.1 F (37.3 C)] 99.1 F (37.3 C) (11/15 0339) Pulse Rate:  [60-78] 78 (11/15 0400) Resp:  [12-27] 15 (11/15 0400) BP: (136-192)/(56-74) 158/59 (11/15 0400) SpO2:  [92 %-100 %] 95 % (11/15 0400)  Weight change:  Filed Weights   12/21/19 0839  Weight: 50.8 kg    Intake/Output: I/O last 3 completed shifts: In: 1197.5 [I.V.:1077.5; IV Piggyback:120] Out: -    Intake/Output this shift:  No intake/output data recorded.  General:NAD, comfortable Heart:RRR, s1s2 nl, no rubs Lungs: Basal decreased breath sound, no increased work of breathing Abdomen:soft, Non-tender, non-distended Extremities:No edema Neurology: Alert awake and following commands.   Basic Metabolic Panel: Recent Labs  Lab 12/21/19 0930 12/22/19 0225  NA 145 145  K 4.3 4.4  CL 113* 115*  CO2 21* 21*  GLUCOSE 93 115*  BUN 52* 50*  CREATININE 5.54* 5.48*  CALCIUM 8.5* 8.1*  PHOS  --  4.7*    Liver Function Tests: Recent Labs  Lab 12/22/19 0225 12/22/19 1428   AST  --  15  ALT  --  11  ALKPHOS  --  34*  BILITOT  --  0.3  PROT  --  4.4*  ALBUMIN 2.1* 2.0*   No results for input(s): LIPASE, AMYLASE in the last 168 hours. No results for input(s): AMMONIA in the last 168 hours.  CBC: Recent Labs  Lab 12/21/19 0930  WBC 5.8  NEUTROABS 4.2  HGB 8.3*  HCT 26.6*  MCV 85.8  PLT 278    Cardiac Enzymes: No results for input(s): CKTOTAL, CKMB, CKMBINDEX, TROPONINI in the last 168 hours.  BNP: Invalid input(s): POCBNP  CBG: Recent Labs  Lab 12/21/19 2121 12/22/19 0705 12/22/19 1120 12/22/19 1617 12/22/19 2121  GLUCAP 188* 101* 135* 124* 147*    Microbiology: Results for orders placed or performed during the hospital encounter of 12/21/19  Respiratory Panel by RT PCR (Flu A&B, Covid) - Nasopharyngeal Swab     Status: None   Collection Time: 12/21/19  9:30 AM   Specimen: Nasopharyngeal Swab  Result Value Ref Range Status   SARS Coronavirus 2 by RT PCR NEGATIVE NEGATIVE Final    Comment: (NOTE) SARS-CoV-2 target nucleic acids are NOT DETECTED.  The SARS-CoV-2 RNA is generally detectable in upper respiratoy specimens during the acute phase of infection. The lowest concentration of SARS-CoV-2 viral copies this assay can detect is 131 copies/mL. A negative result does not preclude SARS-Cov-2 infection and should not be used as the sole basis for treatment or other patient management decisions. A negative result may occur with  improper  specimen collection/handling, submission of specimen other than nasopharyngeal swab, presence of viral mutation(s) within the areas targeted by this assay, and inadequate number of viral copies (<131 copies/mL). A negative result must be combined with clinical observations, patient history, and epidemiological information. The expected result is Negative.  Fact Sheet for Patients:  PinkCheek.be  Fact Sheet for Healthcare Providers:   GravelBags.it  This test is no t yet approved or cleared by the [place] FDA and  has been authorized for detection and/or diagnosis of SARS-CoV-2 by FDA under an Emergency Use Authorization (EUA). This EUA will remain  in effect (meaning this test can be used) for the duration of the COVID-19 declaration under Section 564(b)(1) of the Act, 21 U.S.C. section 360bbb-3(b)(1), unless the authorization is terminated or revoked sooner.     Influenza A by PCR NEGATIVE NEGATIVE Final   Influenza B by PCR NEGATIVE NEGATIVE Final    Comment: (NOTE) The Xpert Xpress SARS-CoV-2/FLU/RSV assay is intended as an aid in  the diagnosis of influenza from Nasopharyngeal swab specimens and  should not be used as a sole basis for treatment. Nasal washings and  aspirates are unacceptable for Xpert Xpress SARS-CoV-2/FLU/RSV  testing.  Fact Sheet for Patients: PinkCheek.be  Fact Sheet for Healthcare Providers: GravelBags.it  This test is not yet approved or cleared by the [place] FDA and  has been authorized for detection and/or diagnosis of SARS-CoV-2 by  FDA under an Emergency Use Authorization (EUA). This EUA will remain  in effect (meaning this test can be used) for the duration of the  Covid-19 declaration under Section 564(b)(1) of the Act, 21  U.S.C. section 360bbb-3(b)(1), unless the authorization is  terminated or revoked. Performed at [facility], [place] [address] [place], [place] [address]   MRSA PCR Screening     Status: None   Collection Time: 12/21/19  3:48 PM   Specimen: Nasal Mucosa; Nasopharyngeal  Result Value Ref Range Status   MRSA by PCR NEGATIVE NEGATIVE Final    Comment:        The GeneXpert MRSA Assay (FDA approved for NASAL specimens only), is one component of a comprehensive MRSA colonization surveillance program. It is not intended to diagnose MRSA infection nor  to guide or monitor treatment for MRSA infections. Performed at [facility], [place] [address] [place], [place] [address]   Gram stain     Status: None   Collection Time: 12/22/19 12:16 PM   Specimen: Peritoneal Washings  Result Value Ref Range Status   Specimen Description PERITONEAL FLUID  Final   Special Requests NONE  Final   Gram Stain   Final    WBC PRESENT,BOTH PMN AND MONONUCLEAR NO ORGANISMS SEEN CYTOSPIN SMEAR Performed at [facility], [address], [place], [place] [address]    Report Status 12/22/2019 FINAL  Final    Coagulation Studies: No results for input(s): LABPROT, INR in the last 72 hours.  Urinalysis: Recent Labs    12/21/19 2040  COLORURINE YELLOW  LABSPEC 1.023  PHURINE 5.0  GLUCOSEU 50*  HGBUR SMALL*  BILIRUBINUR NEGATIVE  KETONESUR NEGATIVE  PROTEINUR >=300*  NITRITE NEGATIVE  LEUKOCYTESUR NEGATIVE      Imaging: DG Chest 1 View  Result Date: 12/22/2019 CLINICAL DATA:  Status post left thoracentesis. EXAM: CHEST  1 VIEW COMPARISON:  December 22, 2019 FINDINGS: Enlarged cardiac silhouette. Decreased left pleural effusion. Mild streaky airspace opacities in the left lung base. No radiographically apparent pneumothorax. Osseous structures  are without acute abnormality. Soft tissues are grossly normal. IMPRESSION: 1. No radiographically apparent pneumothorax. 2. Decreased left pleural effusion. 3. Mild streaky airspace opacities in the left lung base. Electronically Signed   By: Ted Mcalpine M.D.   On: 12/22/2019 13:29   US RENAL  Result Date: 12/21/2019 CLINICAL DATA:  Acute kidney injury EXAM: RENAL / URINARY TRACT ULTRASOUND COMPLETE COMPARISON:  None. FINDINGS: Right Kidney: Renal measurements: 7.2 x 3.4 x 3.8 cm = volume: 48 mL. There is increased cortical echogenicity. There appears to be mild collecting system dilatation. Left Kidney: Renal measurements: 7.7 x 4.4 x 3.7 cm = volume: 66 mL. There is increased cortical  echogenicity with mild collecting system dilatation. Bladder: The ureteral jets were not visualized. Other: Incidentally noted are bilateral pleural effusions that appear to be at least moderate if not large in size. IMPRESSION: 1. Small, echogenic kidneys bilaterally which can be seen in patients with medical renal disease. 2. Mild bilateral collecting system dilatation. 3. The ureteral jets were not visualized. 4. Incidentally noted moderate if not large bilateral pleural effusions. Electronically Signed   By: Katherine Mantle M.D.   On: 12/21/2019 19:05   DG CHEST PORT 1 VIEW  Result Date: 12/22/2019 CLINICAL DATA:  Pleural effusion EXAM: PORTABLE CHEST 1 VIEW COMPARISON:  None. FINDINGS: There is a fairly small left pleural effusion with left base atelectasis. There is a subtle area of ill-defined opacity in the right base. Lungs otherwise are clear. Heart is mildly enlarged with pulmonary vascularity normal. No adenopathy. No bone lesions. IMPRESSION: Small left pleural effusion with left base atelectasis. Focal area of opacity right base concerning for developing pneumonia. Mild cardiomegaly. No adenopathy. Electronically Signed   By: Bretta Bang III M.D.   On: 12/22/2019 11:21   ECHOCARDIOGRAM COMPLETE  Result Date: 12/22/2019    ECHOCARDIOGRAM REPORT   Patient Name:   GENETTE HUERTAS Date of Exam: 12/22/2019 Medical Rec #:  [id number]       Height:       61.0 in Accession #:    [id number]      Weight:       112.0 lb Date of Birth:  1934-02-16       BSA:          1.476 m Patient Age:    85 years        BP:           153/61 mmHg Patient Gender: F               HR:           63 bpm. Exam Location:  Inpatient Procedure: 2D Echo, Cardiac Doppler and Color Doppler Indications:    Pleural effusion  History:        Patient has prior history of Echocardiogram examinations, most                 recent 03/16/2010. Risk Factors:Hypertension, Diabetes and                 Dyslipidemia. CKD.  Sonographer:     Ross Ludwig RDCS (AE) Referring Phys: [phone] DAVID GIRGUIS IMPRESSIONS  1. Left ventricular ejection fraction, by estimation, is 60 to 65%. The left ventricle has normal function. The left ventricle has no regional wall motion abnormalities. There is severe concentric left ventricular hypertrophy. Left ventricular diastolic  parameters are consistent with Grade II diastolic dysfunction (pseudonormalization). Elevated left ventricular end-diastolic pressure.  2. Right ventricular systolic function is  normal. The right ventricular size is normal. There is mildly elevated pulmonary artery systolic pressure. The estimated right ventricular systolic pressure is 84.1 mmHg.  3. Left atrial size was severely dilated.  4. A small pericardial effusion is present. The pericardial effusion is circumferential.  5. The mitral valve is degenerative. Mild mitral valve regurgitation. No evidence of mitral stenosis.  6. The aortic valve is tricuspid. Aortic valve regurgitation is trivial. Mild aortic valve sclerosis is present, with no evidence of aortic valve stenosis.  7. The inferior vena cava is dilated in size with >50% respiratory variability, suggesting right atrial pressure of 8 mmHg.  8. The LV myocardium is severly thickened with a speckeled pattern. In the setting of pericardial effusion and severe LA enlargement, would consider possible amyloidosis. Consider PYP scan or cardiac MRI if clinically indicated. FINDINGS  Left Ventricle: Left ventricular ejection fraction, by estimation, is 60 to 65%. The left ventricle has normal function. The left ventricle has no regional wall motion abnormalities. The left ventricular internal cavity size was normal in size. There is  severe concentric left ventricular hypertrophy. Left ventricular diastolic parameters are consistent with Grade II diastolic dysfunction (pseudonormalization). Elevated left ventricular end-diastolic pressure. Right Ventricle: The right ventricular size is  normal. No increase in right ventricular wall thickness. Right ventricular systolic function is normal. There is mildly elevated pulmonary artery systolic pressure. The tricuspid regurgitant velocity is 2.73  m/s, and with an assumed right atrial pressure of 8 mmHg, the estimated right ventricular systolic pressure is 66.0 mmHg. Left Atrium: Left atrial size was severely dilated. Right Atrium: Right atrial size was normal in size. Pericardium: A small pericardial effusion is present. The pericardial effusion is circumferential. Mitral Valve: The mitral valve is degenerative in appearance. There is mild thickening of the mitral valve leaflet(s). Mild mitral annular calcification. Mild mitral valve regurgitation. No evidence of mitral valve stenosis. MV peak gradient, 6.4 mmHg. The mean mitral valve gradient is 2.0 mmHg. Tricuspid Valve: The tricuspid valve is normal in structure. Tricuspid valve regurgitation is trivial. No evidence of tricuspid stenosis. Aortic Valve: The aortic valve is tricuspid. Aortic valve regurgitation is trivial. Mild aortic valve sclerosis is present, with no evidence of aortic valve stenosis. Aortic valve mean gradient measures 5.0 mmHg. Aortic valve peak gradient measures 8.4 mmHg. Aortic valve area, by VTI measures 1.85 cm. Pulmonic Valve: The pulmonic valve was normal in structure. Pulmonic valve regurgitation is not visualized. No evidence of pulmonic stenosis. Aorta: The aortic root is normal in size and structure. Venous: The inferior vena cava is dilated in size with greater than 50% respiratory variability, suggesting right atrial pressure of 8 mmHg. IAS/Shunts: No atrial level shunt detected by color flow Doppler.  LEFT VENTRICLE PLAX 2D LVIDd:         4.20 cm  Diastology LVIDs:         2.20 cm  LV e' medial:    6.08 cm/s LV PW:         1.60 cm  LV E/e' medial:  17.4 LV IVS:        1.80 cm  LV e' lateral:   6.83 cm/s LVOT diam:     1.60 cm  LV E/e' lateral: 15.5 LV SV:         73  LV SV Index:   49 LVOT Area:     2.01 cm  RIGHT VENTRICLE            IVC RV Basal diam:  3.00 cm  IVC diam: 2.10 cm RV S prime:     9.30 cm/s TAPSE (M-mode): 2.3 cm LEFT ATRIUM              Index       RIGHT ATRIUM           Index LA diam:        3.60 cm  2.44 cm/m  RA Area:     15.10 cm LA Vol (A2C):   96.4 ml  65.30 ml/m RA Volume:   36.20 ml  24.52 ml/m LA Vol (A4C):   102.0 ml 69.09 ml/m LA Biplane Vol: 103.0 ml 69.77 ml/m  AORTIC VALVE AV Area (Vmax):    1.90 cm AV Area (Vmean):   1.63 cm AV Area (VTI):     1.85 cm AV Vmax:           145.00 cm/s AV Vmean:          107.000 cm/s AV VTI:            0.393 m AV Peak Grad:      8.4 mmHg AV Mean Grad:      5.0 mmHg LVOT Vmax:         137.00 cm/s LVOT Vmean:        86.600 cm/s LVOT VTI:          0.361 m LVOT/AV VTI ratio: 0.92  AORTA Ao Root diam: 2.80 cm Ao Asc diam:  2.00 cm MITRAL VALVE                TRICUSPID VALVE MV Area (PHT): 3.77 cm     TR Peak grad:   29.8 mmHg MV Peak grad:  6.4 mmHg     TR Vmax:        273.00 cm/s MV Mean grad:  2.0 mmHg MV Vmax:       1.26 m/s     SHUNTS MV Vmean:      60.6 cm/s    Systemic VTI:  0.36 m MV Decel Time: 201 msec     Systemic Diam: 1.60 cm MV E velocity: 106.00 cm/s MV A velocity: 53.60 cm/s MV E/A ratio:  1.98 Armanda Magic MD Electronically signed by Armanda Magic MD Signature Date/Time: 12/22/2019/1:37:09 PM    Final    US THORACENTESIS ASP PLEURAL SPACE W/IMG GUIDE  Result Date: 12/22/2019 INDICATION: Progressive renal failure with fluid overload and bilateral pleural effusions. Request for diagnostic and therapeutic thoracentesis. EXAM: ULTRASOUND GUIDED LEFT THORACENTESIS MEDICATIONS: 1% lidocaine 10 mL COMPLICATIONS: None immediate. PROCEDURE: An ultrasound guided thoracentesis was thoroughly discussed with the patient and questions answered. The benefits, risks, alternatives and complications were also discussed. The patient understands and wishes to proceed with the procedure. Written consent was  obtained. Ultrasound was performed to localize and mark an adequate pocket of fluid in the left chest. The area was then prepped and draped in the normal sterile fashion. 1% Lidocaine was used for local anesthesia. Under ultrasound guidance a 6 Fr Safe-T-Centesis catheter was introduced. Thoracentesis was performed. The catheter was removed and a dressing applied. FINDINGS: A total of approximately 800 mL of clear yellow fluid was removed. Samples were sent to the laboratory as requested by the clinical team. IMPRESSION: Successful ultrasound guided left thoracentesis yielding 800 mL of pleural fluid. Read by: Corrin Parker, PA-C Electronically Signed   By: Irish Lack M.D.   On: 12/22/2019 12:38     Medications:   . sodium chloride 10 mL/hr at 12/23/19 0400  .  ferric gluconate (FERRLECIT/NULECIT) IV Stopped (12/22/19 1948)  . niCARDipine 5 mg/hr (12/23/19 0400)   . amLODipine  10 mg Oral Daily  . Chlorhexidine Gluconate Cloth  6 each Topical Daily  . cloNIDine  0.2 mg Oral BID  . doxazosin  2 mg Oral Daily  . insulin aspart  0-5 Units Subcutaneous QHS  . insulin aspart  0-6 Units Subcutaneous TID WC  . sodium chloride flush  3 mL Intravenous Q12H   acetaminophen **OR** acetaminophen  Assessment/ Plan:  1. 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 Kidney ultrasound with echogenic kidneys however incidental finding of pleural effusion. Planning for kidney biopsy.  12/23/2019.  Noted drop in hemoglobin.  Would hold off on renal biopsy at this time. Monitor BMP, strict ins and out, avoid nephrotoxins.  2. Nephrotic range proteinuria, concerning for FSGS, collapsing variant. Around 10 g of proteinuria. Secondary work-up including SPEP, kappa lambda ratio, hep B, hep C, HIV unremarkable. CPK only elevated to 467. Repeat UA with no RBC, 9.2 g of protein. Pending  ANA, ANCA, C3, C4, anti-GBM. IR to perform renal biopsy 12/23/2019 however concerns regarding abrupt drop in hemoglobin.  This will need to be evaluated before renal biopsy.  Needs better blood pressure control.  Continue to hold aspirin.  3.Hypertensive urgency: Amlodipine dose increased to 10 mg.  Continue doxazosin and clonidine.  Try to wean Cardene drip gradually.   4.Anemia: Iron saturation 12% therefore I will order IV iron.  Monitor hemoglobin. Further evaluation including stool occult blood test etc. defer to primary team.  5.Bilateral moderate to large pleural effusion seen on kidney ultrasound: She is requiring oxygen this morning.  Some shortness of breath.  I have discussed this finding with primary team.  Getting x-ray and possible thoracocentesis by IR.   LOS: 2 Garnetta Buddy @TODAY @7 :08 AM

## 2019-12-23 NOTE — Progress Notes (Addendum)
PROGRESS NOTE    Valerie Bonilla   HYQ:[id number]  DOB: March 07, 1934  DOA: 12/21/2019     2  PCP: Maurice Small, MD  CC: elevated BP, increasing creatinine   Hospital Course: [name] is an 85 yo AA female with PMH resistant HTN, DMII, HLD, CKD (unknown stage), gout who presented after she was told to come for an uprising creatinine outpatient.  She follows with Dr. Thedore Mins and baseline creat is 1.2-1.3. Her creatinine has started rising for unknown reasons; she has also had recent uncontrolled HTN (per note, office SBP 230 mmHg).  She was recommended to present to the ER for BP control, renal biopsy, and further workup/evaluation.   On assessment in the ER her BP was 210s-250s/70s-100s). She states compliance at home with her BP regimen without any missed doses recently (at home on amlodipine, clonidine, doxazosin, toprol).   She was recommended to be placed on nicardipine drip given excessively elevated pressures despite home regimen compliance. Nephrology was consulted and IR with plans for renal biopsy after admission and BP stabilization.   She is accompanied by her son in the ER. She denies any headaches, vision changes, chest pain, shortness of breath, abdominal pain.  The morning after admission she was also noted to be hypoxic requiring oxygen overnight.  Her renal ultrasound had noted pleural effusions.  She underwent CXR which showed a left pleural effusion; IR was consulted and patient underwent a thoracentesis on the left which removed 800 cc yellow fluid.    Interval History:  Breathing better this am after thoracentesis yesterday.  Hgb was also low 6.2 g/dL. She denies bleeding, dark stools, hematuria. No history of ulcers/GERD. Does not smoke or drink. No excessive NSAIDs (rare ibuprofen use recently  1-2 pills). Has not been on maintenance IVF, just fluids with various drips.   Old records reviewed in assessment of this patient  ROS: Constitutional: negative for  chills and fevers, Respiratory: positive for SOB, Cardiovascular: negative for chest pain and Gastrointestinal: negative for abdominal pain  Assessment & Plan: Hypertensive emergency - patient has had difficulty at least recently with BP control (elevated SBP 230 in office recently); patient states compliance with meds - renal function has rapidly deteriorated recently (concern for underlying glomerular disease per neprho, which may be contributed from by severely elevated BP) - continue home meds - continue cardene; weaning as able; okay to pursue BP <140/90 - increase home amlodipine to 10 mg - BP still uncontrolled as cardene titrated up overnight of 11/14; add on hydralazine this am and monitor BP response  IDA (iron deficiency anemia) - ferritin 66, iron 24, sat ratio 12% - s/p nulecit started  daily on 11/14 - no obvious bleeding or large risk factors but Hgb has downtrended 8.3>>6.2 g/dL on morning of 95/28 - no prior transfusions received in her life she says; no hx ulcers etc; does not use NSAIDs (very rare) - reports brown stools; will check FOBT on next stool - transfuse 1 unit PRBC today and recheck H/H 1 hour after   Acute diastolic CHF (congestive heart failure) (HCC) - echo obtained due to uncontrolled BP - EF 60-65%, Gr II DD, severe LVH, LA dilated; LV myocardium noted to be "severely thickened with a speckled pattern" - due to underlying rapid renal failure with increased protein, unsure if the two could be related to an underlying amyloid process? - cardiology consulted to see if needs further imaging (PYP scan vs MRI) - check BNP; no obvious overload/edema in  extremities but CXR on 11/14 had left pleural effusion s/p 800 cc from thoracentesis and echo also mentions small pericardial effusion  Acute respiratory failure with hypoxia (HCC) -Likely due to left pleural effusion.  Unclear etiology, possibly CHF -CXR ordered 11/14.  Left pleural effusion noted; possible  opacity/infiltrate in right lung base - thoracentesis ordered and done on 11/14; 800cc fluid removed - follow up pleural studies; negative for signs of infection - wean O2 as able   Acute on chronic kidney failure (HCC) - CKD stage unknown; no prior values or notes available for review - baseline creat 1.2 - 1.3 per nephrology and creat has been uptrending and is 5.54 on admission -concern is for FSGS or other glomerular disease  - nephrology aware of patient - obtain renal u/s (small, echogenic kidneys; mild dilated collecting system) and complete duplex (still pending) - order placed for renal biopsy and spoke with radiology; they will plan on Monday tentatively; keep asa on hold  - trend BMP; control BP - strict I&O - follow up nephrology consult   Pleural effusion - see respi failure - follow up pleural studies  - obtain echo   Type 2 diabetes mellitus with other specified complication (HCC) - check A1c = 5% - continue SSI and CBGs  Hyperlipidemia - hold statin for now, can resume when more medically stable    Antimicrobials: none  DVT prophylaxis: SCD Code Status: Full Family Communication: none present Disposition Plan: Status is: Inpatient  Remains inpatient appropriate because:Hemodynamically unstable, Ongoing diagnostic testing needed not appropriate for outpatient work up, Unsafe d/c plan, IV treatments appropriate due to intensity of illness or inability to take PO and Inpatient level of care appropriate due to severity of illness   Dispo: The patient is from: Home              Anticipated d/c is to: Home              Anticipated d/c date is: > 3 days              Patient currently is not medically stable to d/c. Objective: Blood pressure (!) 156/57, pulse 73, temperature 98.8 F (37.1 C), temperature source Oral, resp. rate 15, height 5\' 1"  (1.549 m), weight 50.8 kg, SpO2 96 %.  Examination: General appearance: alert, cooperative and no distress Head:  Normocephalic, without obvious abnormality, atraumatic Eyes: EOMI Lungs: slightly decreased bibasilar breath sounds otherwise clear throughout; no wheezing Heart: regular rate and rhythm and S1, S2 normal Abdomen: normal findings: bowel sounds normal and soft, non-tender Extremities: no edema Skin: mobility and turgor normal Neurologic: Grossly normal  Consultants:   Nephrology  Procedures:   12/22/19: Left thoracentesis   Data Reviewed: I have personally reviewed following labs and imaging studies Results for orders placed or performed during the hospital encounter of 12/21/19 (from the past 24 hour(s))  Lactate dehydrogenase     Status: Abnormal   Collection Time: 12/22/19  2:28 PM  Result Value Ref Range   LDH 224 (H) 98 - 192 U/L  Hepatic function panel     Status: Abnormal   Collection Time: 12/22/19  2:28 PM  Result Value Ref Range   Total Protein 4.4 (L) 6.5 - 8.1 g/dL   Albumin 2.0 (L) 3.5 - 5.0 g/dL   AST 15 15 - 41 U/L   ALT 11 0 - 44 U/L   Alkaline Phosphatase 34 (L) 38 - 126 U/L   Total Bilirubin 0.3 0.3 - 1.2 mg/dL  Bilirubin, Direct <0.1 0.0 - 0.2 mg/dL   Indirect Bilirubin NOT CALCULATED 0.3 - 0.9 mg/dL  Glucose, capillary     Status: Abnormal   Collection Time: 12/22/19  4:17 PM  Result Value Ref Range   Glucose-Capillary 124 (H) 70 - 99 mg/dL  Glucose, capillary     Status: Abnormal   Collection Time: 12/22/19  9:21 PM  Result Value Ref Range   Glucose-Capillary 147 (H) 70 - 99 mg/dL   Comment 1 Notify RN    Comment 2 Document in Chart   Basic metabolic panel     Status: Abnormal   Collection Time: 12/23/19  6:32 AM  Result Value Ref Range   Sodium 143 135 - 145 mmol/L   Potassium 4.7 3.5 - 5.1 mmol/L   Chloride 115 (H) 98 - 111 mmol/L   CO2 20 (L) 22 - 32 mmol/L   Glucose, Bld 102 (H) 70 - 99 mg/dL   BUN 53 (H) 8 - 23 mg/dL   Creatinine, Ser 1.61 (H) 0.44 - 1.00 mg/dL   Calcium 8.0 (L) 8.9 - 10.3 mg/dL   GFR, Estimated 7 (L) >60 mL/min    Anion gap 8 5 - 15  CBC with Differential/Platelet     Status: Abnormal   Collection Time: 12/23/19  6:32 AM  Result Value Ref Range   WBC 8.6 4.0 - 10.5 K/uL   RBC 2.28 (L) 3.87 - 5.11 MIL/uL   Hemoglobin 6.2 (LL) 12.0 - 15.0 g/dL   HCT 09.6 (L) 36 - 46 %   MCV 85.5 80.0 - 100.0 fL   MCH 27.2 26.0 - 34.0 pg   MCHC 31.8 30.0 - 36.0 g/dL   RDW 04.5 40.9 - 81.1 %   Platelets 248 150 - 400 K/uL   nRBC 0.0 0.0 - 0.2 %   Neutrophils Relative % 76 %   Neutro Abs 6.6 1.7 - 7.7 K/uL   Lymphocytes Relative 11 %   Lymphs Abs 1.0 0.7 - 4.0 K/uL   Monocytes Relative 7 %   Monocytes Absolute 0.6 0.1 - 1.0 K/uL   Eosinophils Relative 5 %   Eosinophils Absolute 0.4 0.0 - 0.5 K/uL   Basophils Relative 0 %   Basophils Absolute 0.0 0.0 - 0.1 K/uL   Immature Granulocytes 1 %   Abs Immature Granulocytes 0.04 0.00 - 0.07 K/uL  Magnesium     Status: Abnormal   Collection Time: 12/23/19  6:32 AM  Result Value Ref Range   Magnesium 1.6 (L) 1.7 - 2.4 mg/dL  Phosphorus     Status: None   Collection Time: 12/23/19  6:32 AM  Result Value Ref Range   Phosphorus 4.6 2.5 - 4.6 mg/dL  ABO/Rh     Status: None   Collection Time: 12/23/19  6:32 AM  Result Value Ref Range   ABO/RH(D)      O POS Performed at [facility], [address] [place], [place] [address]   Glucose, capillary     Status: None   Collection Time: 12/23/19  7:56 AM  Result Value Ref Range   Glucose-Capillary 87 70 - 99 mg/dL  Type and screen [facility]     Status: None (Preliminary result)   Collection Time: 12/23/19 10:22 AM  Result Value Ref Range   ABO/RH(D) O POS    Antibody Screen NEG    Sample Expiration 12/26/2019,2359    Unit Number [id number]    Blood Component Type RED CELLS,LR  Unit division 00    Status of Unit ALLOCATED    Transfusion Status OK TO TRANSFUSE    Crossmatch Result      Compatible Performed at [facility], [address], [place], [place] [address]   Prepare  RBC (crossmatch)     Status: None   Collection Time: 12/23/19 10:22 AM  Result Value Ref Range   Order Confirmation      ORDER PROCESSED BY BLOOD BANK Performed at [facility], [address] [place], [place] [address]   Glucose, capillary     Status: None   Collection Time: 12/23/19 11:32 AM  Result Value Ref Range   Glucose-Capillary 95 70 - 99 mg/dL    Recent Results (from the past 240 hour(s))  Respiratory Panel by RT PCR (Flu A&B, Covid) - Nasopharyngeal Swab     Status: None   Collection Time: 12/21/19  9:30 AM   Specimen: Nasopharyngeal Swab  Result Value Ref Range Status   SARS Coronavirus 2 by RT PCR NEGATIVE NEGATIVE Final    Comment: (NOTE) SARS-CoV-2 target nucleic acids are NOT DETECTED.  The SARS-CoV-2 RNA is generally detectable in upper respiratoy specimens during the acute phase of infection. The lowest concentration of SARS-CoV-2 viral copies this assay can detect is 131 copies/mL. A negative result does not preclude SARS-Cov-2 infection and should not be used as the sole basis for treatment or other patient management decisions. A negative result may occur with  improper specimen collection/handling, submission of specimen other than nasopharyngeal swab, presence of viral mutation(s) within the areas targeted by this assay, and inadequate number of viral copies (<131 copies/mL). A negative result must be combined with clinical observations, patient history, and epidemiological information. The expected result is Negative.  Fact Sheet for Patients:  https://www.moore.com/  Fact Sheet for Healthcare Providers:  https://www.young.biz/  This test is no t yet approved or cleared by the [place] FDA and  has been authorized for detection and/or diagnosis of SARS-CoV-2 by FDA under an Emergency Use Authorization (EUA). This EUA will remain  in effect (meaning this test can be used) for the duration of  the COVID-19 declaration under Section 564(b)(1) of the Act, 21 U.S.C. section 360bbb-3(b)(1), unless the authorization is terminated or revoked sooner.     Influenza A by PCR NEGATIVE NEGATIVE Final   Influenza B by PCR NEGATIVE NEGATIVE Final    Comment: (NOTE) The Xpert Xpress SARS-CoV-2/FLU/RSV assay is intended as an aid in  the diagnosis of influenza from Nasopharyngeal swab specimens and  should not be used as a sole basis for treatment. Nasal washings and  aspirates are unacceptable for Xpert Xpress SARS-CoV-2/FLU/RSV  testing.  Fact Sheet for Patients: https://www.moore.com/  Fact Sheet for Healthcare Providers: https://www.young.biz/  This test is not yet approved or cleared by the [place] FDA and  has been authorized for detection and/or diagnosis of SARS-CoV-2 by  FDA under an Emergency Use Authorization (EUA). This EUA will remain  in effect (meaning this test can be used) for the duration of the  Covid-19 declaration under Section 564(b)(1) of the Act, 21  U.S.C. section 360bbb-3(b)(1), unless the authorization is  terminated or revoked. Performed at [facility], [address] [place], [place] [address]   MRSA PCR Screening     Status: None   Collection Time: 12/21/19  3:48 PM   Specimen: Nasal Mucosa; Nasopharyngeal  Result Value Ref Range Status   MRSA by PCR NEGATIVE NEGATIVE Final  Comment:        The GeneXpert MRSA Assay (FDA approved for NASAL specimens only), is one component of a comprehensive MRSA colonization surveillance program. It is not intended to diagnose MRSA infection nor to guide or monitor treatment for MRSA infections. Performed at [facility], [address], [place], [place] [address]   Urine Culture     Status: Abnormal (Preliminary result)   Collection Time: 12/21/19  8:37 PM   Specimen: Urine, Random  Result Value Ref Range Status   Specimen Description URINE,  RANDOM  Final   Special Requests NONE  Final   Culture (A)  Final    30,000 COLONIES/mL ESCHERICHIA COLI SUSCEPTIBILITIES TO FOLLOW Performed at [facility], [address], [place], [place] [address]    Report Status PENDING  Incomplete  Culture, body fluid-bottle     Status: None (Preliminary result)   Collection Time: 12/22/19 12:16 PM   Specimen: Peritoneal Washings  Result Value Ref Range Status   Specimen Description PERITONEAL FLUID  Final   Special Requests NONE  Final   Culture   Final    NO GROWTH < 24 HOURS Performed at [facility], [address], [place], [place] [address]    Report Status PENDING  Incomplete  Gram stain     Status: None   Collection Time: 12/22/19 12:16 PM   Specimen: Peritoneal Washings  Result Value Ref Range Status   Specimen Description PERITONEAL FLUID  Final   Special Requests NONE  Final   Gram Stain   Final    WBC PRESENT,BOTH PMN AND MONONUCLEAR NO ORGANISMS SEEN CYTOSPIN SMEAR Performed at [facility], [address], [place], [place] [address]    Report Status 12/22/2019 FINAL  Final     Radiology Studies: DG Chest 1 View  Result Date: 12/22/2019 CLINICAL DATA:  Status post left thoracentesis. EXAM: CHEST  1 VIEW COMPARISON:  December 22, 2019 FINDINGS: Enlarged cardiac silhouette. Decreased left pleural effusion. Mild streaky airspace opacities in the left lung base. No radiographically apparent pneumothorax. Osseous structures are without acute abnormality. Soft tissues are grossly normal. IMPRESSION: 1. No radiographically apparent pneumothorax. 2. Decreased left pleural effusion. 3. Mild streaky airspace opacities in the left lung base. Electronically Signed   By: Ted Mcalpine M.D.   On: 12/22/2019 13:29   US RENAL  Result Date: 12/21/2019 CLINICAL DATA:  Acute kidney injury EXAM: RENAL / URINARY TRACT ULTRASOUND COMPLETE COMPARISON:  None. FINDINGS: Right Kidney: Renal measurements: 7.2 x 3.4 x 3.8  cm = volume: 48 mL. There is increased cortical echogenicity. There appears to be mild collecting system dilatation. Left Kidney: Renal measurements: 7.7 x 4.4 x 3.7 cm = volume: 66 mL. There is increased cortical echogenicity with mild collecting system dilatation. Bladder: The ureteral jets were not visualized. Other: Incidentally noted are bilateral pleural effusions that appear to be at least moderate if not large in size. IMPRESSION: 1. Small, echogenic kidneys bilaterally which can be seen in patients with medical renal disease. 2. Mild bilateral collecting system dilatation. 3. The ureteral jets were not visualized. 4. Incidentally noted moderate if not large bilateral pleural effusions. Electronically Signed   By: Katherine Mantle M.D.   On: 12/21/2019 19:05   DG CHEST PORT 1 VIEW  Result Date: 12/22/2019 CLINICAL DATA:  Pleural effusion EXAM: PORTABLE CHEST 1 VIEW COMPARISON:  None. FINDINGS: There is a fairly small left pleural effusion with left base atelectasis. There is a  subtle area of ill-defined opacity in the right base. Lungs otherwise are clear. Heart is mildly enlarged with pulmonary vascularity normal. No adenopathy. No bone lesions. IMPRESSION: Small left pleural effusion with left base atelectasis. Focal area of opacity right base concerning for developing pneumonia. Mild cardiomegaly. No adenopathy. Electronically Signed   By: Bretta Bang III M.D.   On: 12/22/2019 11:21   ECHOCARDIOGRAM COMPLETE  Result Date: 12/22/2019    ECHOCARDIOGRAM REPORT   Patient Name:   MARIALY URBANCZYK Date of Exam: 12/22/2019 Medical Rec #:  [id number]       Height:       61.0 in Accession #:    [id number]      Weight:       112.0 lb Date of Birth:  1934-04-27       BSA:          1.476 m Patient Age:    85 years        BP:           153/61 mmHg Patient Gender: F               HR:           63 bpm. Exam Location:  Inpatient Procedure: 2D Echo, Cardiac Doppler and Color Doppler Indications:    Pleural  effusion  History:        Patient has prior history of Echocardiogram examinations, most                 recent 03/16/2010. Risk Factors:Hypertension, Diabetes and                 Dyslipidemia. CKD.  Sonographer:    Ross Ludwig RDCS (AE) Referring Phys: [phone] Rosbel Buckner IMPRESSIONS  1. Left ventricular ejection fraction, by estimation, is 60 to 65%. The left ventricle has normal function. The left ventricle has no regional wall motion abnormalities. There is severe concentric left ventricular hypertrophy. Left ventricular diastolic  parameters are consistent with Grade II diastolic dysfunction (pseudonormalization). Elevated left ventricular end-diastolic pressure.  2. Right ventricular systolic function is normal. The right ventricular size is normal. There is mildly elevated pulmonary artery systolic pressure. The estimated right ventricular systolic pressure is 37.8 mmHg.  3. Left atrial size was severely dilated.  4. A small pericardial effusion is present. The pericardial effusion is circumferential.  5. The mitral valve is degenerative. Mild mitral valve regurgitation. No evidence of mitral stenosis.  6. The aortic valve is tricuspid. Aortic valve regurgitation is trivial. Mild aortic valve sclerosis is present, with no evidence of aortic valve stenosis.  7. The inferior vena cava is dilated in size with >50% respiratory variability, suggesting right atrial pressure of 8 mmHg.  8. The LV myocardium is severly thickened with a speckeled pattern. In the setting of pericardial effusion and severe LA enlargement, would consider possible amyloidosis. Consider PYP scan or cardiac MRI if clinically indicated. FINDINGS  Left Ventricle: Left ventricular ejection fraction, by estimation, is 60 to 65%. The left ventricle has normal function. The left ventricle has no regional wall motion abnormalities. The left ventricular internal cavity size was normal in size. There is  severe concentric left ventricular hypertrophy.  Left ventricular diastolic parameters are consistent with Grade II diastolic dysfunction (pseudonormalization). Elevated left ventricular end-diastolic pressure. Right Ventricle: The right ventricular size is normal. No increase in right ventricular wall thickness. Right ventricular systolic function is normal. There is mildly elevated pulmonary artery systolic pressure. The tricuspid regurgitant velocity  is 2.73  m/s, and with an assumed right atrial pressure of 8 mmHg, the estimated right ventricular systolic pressure is 37.8 mmHg. Left Atrium: Left atrial size was severely dilated. Right Atrium: Right atrial size was normal in size. Pericardium: A small pericardial effusion is present. The pericardial effusion is circumferential. Mitral Valve: The mitral valve is degenerative in appearance. There is mild thickening of the mitral valve leaflet(s). Mild mitral annular calcification. Mild mitral valve regurgitation. No evidence of mitral valve stenosis. MV peak gradient, 6.4 mmHg. The mean mitral valve gradient is 2.0 mmHg. Tricuspid Valve: The tricuspid valve is normal in structure. Tricuspid valve regurgitation is trivial. No evidence of tricuspid stenosis. Aortic Valve: The aortic valve is tricuspid. Aortic valve regurgitation is trivial. Mild aortic valve sclerosis is present, with no evidence of aortic valve stenosis. Aortic valve mean gradient measures 5.0 mmHg. Aortic valve peak gradient measures 8.4 mmHg. Aortic valve area, by VTI measures 1.85 cm. Pulmonic Valve: The pulmonic valve was normal in structure. Pulmonic valve regurgitation is not visualized. No evidence of pulmonic stenosis. Aorta: The aortic root is normal in size and structure. Venous: The inferior vena cava is dilated in size with greater than 50% respiratory variability, suggesting right atrial pressure of 8 mmHg. IAS/Shunts: No atrial level shunt detected by color flow Doppler.  LEFT VENTRICLE PLAX 2D LVIDd:         4.20 cm  Diastology  LVIDs:         2.20 cm  LV e' medial:    6.08 cm/s LV PW:         1.60 cm  LV E/e' medial:  17.4 LV IVS:        1.80 cm  LV e' lateral:   6.83 cm/s LVOT diam:     1.60 cm  LV E/e' lateral: 15.5 LV SV:         73 LV SV Index:   49 LVOT Area:     2.01 cm  RIGHT VENTRICLE            IVC RV Basal diam:  3.00 cm    IVC diam: 2.10 cm RV S prime:     9.30 cm/s TAPSE (M-mode): 2.3 cm LEFT ATRIUM              Index       RIGHT ATRIUM           Index LA diam:        3.60 cm  2.44 cm/m  RA Area:     15.10 cm LA Vol (A2C):   96.4 ml  65.30 ml/m RA Volume:   36.20 ml  24.52 ml/m LA Vol (A4C):   102.0 ml 69.09 ml/m LA Biplane Vol: 103.0 ml 69.77 ml/m  AORTIC VALVE AV Area (Vmax):    1.90 cm AV Area (Vmean):   1.63 cm AV Area (VTI):     1.85 cm AV Vmax:           145.00 cm/s AV Vmean:          107.000 cm/s AV VTI:            0.393 m AV Peak Grad:      8.4 mmHg AV Mean Grad:      5.0 mmHg LVOT Vmax:         137.00 cm/s LVOT Vmean:        86.600 cm/s LVOT VTI:          0.361 m LVOT/AV VTI  ratio: 0.92  AORTA Ao Root diam: 2.80 cm Ao Asc diam:  2.00 cm MITRAL VALVE                TRICUSPID VALVE MV Area (PHT): 3.77 cm     TR Peak grad:   29.8 mmHg MV Peak grad:  6.4 mmHg     TR Vmax:        273.00 cm/s MV Mean grad:  2.0 mmHg MV Vmax:       1.26 m/s     SHUNTS MV Vmean:      60.6 cm/s    Systemic VTI:  0.36 m MV Decel Time: 201 msec     Systemic Diam: 1.60 cm MV E velocity: 106.00 cm/s MV A velocity: 53.60 cm/s MV E/A ratio:  1.98 Armanda Magic MD Electronically signed by Armanda Magic MD Signature Date/Time: 12/22/2019/1:37:09 PM    Final    US THORACENTESIS ASP PLEURAL SPACE W/IMG GUIDE  Result Date: 12/22/2019 INDICATION: Progressive renal failure with fluid overload and bilateral pleural effusions. Request for diagnostic and therapeutic thoracentesis. EXAM: ULTRASOUND GUIDED LEFT THORACENTESIS MEDICATIONS: 1% lidocaine 10 mL COMPLICATIONS: None immediate. PROCEDURE: An ultrasound guided thoracentesis was thoroughly  discussed with the patient and questions answered. The benefits, risks, alternatives and complications were also discussed. The patient understands and wishes to proceed with the procedure. Written consent was obtained. Ultrasound was performed to localize and mark an adequate pocket of fluid in the left chest. The area was then prepped and draped in the normal sterile fashion. 1% Lidocaine was used for local anesthesia. Under ultrasound guidance a 6 Fr Safe-T-Centesis catheter was introduced. Thoracentesis was performed. The catheter was removed and a dressing applied. FINDINGS: A total of approximately 800 mL of clear yellow fluid was removed. Samples were sent to the laboratory as requested by the clinical team. IMPRESSION: Successful ultrasound guided left thoracentesis yielding 800 mL of pleural fluid. Read by: Corrin Parker, PA-C Electronically Signed   By: Irish Lack M.D.   On: 12/22/2019 12:38   DG Chest 1 View  Final Result    US THORACENTESIS ASP PLEURAL SPACE W/IMG GUIDE  Final Result    DG CHEST PORT 1 VIEW  Final Result    US RENAL  Final Result    US BIOPSY (KIDNEY)    (Results Pending)  VAS US RENAL ARTERY DUPLEX    (Results Pending)    Scheduled Meds: . sodium chloride   Intravenous Once  . amLODipine  10 mg Oral Daily  . Chlorhexidine Gluconate Cloth  6 each Topical Daily  . cloNIDine  0.2 mg Oral BID  . doxazosin  2 mg Oral Daily  . hydrALAZINE  25 mg Oral TID  . insulin aspart  0-5 Units Subcutaneous QHS  . insulin aspart  0-6 Units Subcutaneous TID WC  . sodium chloride flush  3 mL Intravenous Q12H   PRN Meds: acetaminophen **OR** acetaminophen Continuous Infusions: . sodium chloride 10 mL/hr at 12/23/19 0700  . niCARDipine 7.5 mg/hr (12/23/19 0847)     LOS: 2 days  Critical care time to evaluate and treat this patient was 35 minutes.  Independent of separate billable services  This patient is critically ill with the following life-threatening issues  requiring my presence at the bedside: Hemodynamic instability requiring titration of medications Oxygenation/ventilation instability requiring frequent modifications of support Cardiac rhythm disturbances requiring evaluation and/or interventions Fluctuations in neurologic function requiring evaluation and/or interventions and/or fluid/volume titration  Lewie Chamber, MD [facility] 12/23/2019,  12:30 PM

## 2019-12-23 NOTE — Consult Note (Addendum)
Cardiology Consultation:   Patient ID: Valerie Bonilla; [id number]; 02/25/1934   Admit date: 12/21/2019 Date of Consult: 12/23/2019  Primary Care Provider: Maurice Small, MD Primary Cardiologist: none Primary Electrophysiologist:  none   Patient Profile:   Valerie Bonilla is a 85 y.o. female with a hx of HTN, DMII, HLD, CKD, gout who is being seen today for the evaluation of left ventricular hypertrophy at the request of Dr. Lewie Chamber.  History of Present Illness:   Valerie Bonilla presented to [facility] ED 2/2 to abnormal lab tested showed significantly elevated kidney from baseline in the setting of SBP in the 210-250's despite strict compliance to antihypertensive medicaitons and was admitted to the ICU for HTN emergency. She was started nicardipine gtt with improvement of her SBP to 140's. Patient subsequently developed hypoxic respiratory failure, and has found to have a pleural effusion on CXR. She is now s/p thoracentesis with removal of 800 cc of yellow fluid. Patient's kidney function continued to decline with nephrotic range proteuria. Nephrology have been working the patient up for paraproteinemia and is now s/p kidney biopsy. Further work up with TTE showed grade II diastolic dysfunction with LV hypertrophy with associated left atrial enlargement and pericardial effusion concerning for amyloidosis.   Past Medical History:  Diagnosis Date  . Acute diastolic CHF (congestive heart failure) (HCC) 12/23/2019  . Allergic rhinitis   . CKD (chronic kidney disease)   . Diabetes (HCC)   . Gout   . Heart murmur   . HTN (hypertension)   . Hypercholesteremia   . Hyperlipidemia   . Medical history non-contributory   . Osteoarthritis   . Osteopenia   . Plantar fasciitis   . Vitamin D deficiency     Past Surgical History:  Procedure Laterality Date  . CESAREAN SECTION    . COLONOSCOPY    . WISDOM TOOTH EXTRACTION       Home Medications:  Prior to Admission medications     Medication Sig Start Date End Date Taking? Authorizing Provider  amLODipine (NORVASC) 5 MG tablet Take 5 mg by mouth daily. 10/11/19  Yes [provider]  cholecalciferol (VITAMIN D3) 25 MCG (1000 UNIT) tablet Take 1,000 Units by mouth daily.   Yes [provider]  cloNIDine (CATAPRES) 0.2 MG tablet Take 1 tablet (0.2 mg total) by mouth 2 (two) times daily. Patient taking differently: Take 0.2 mg by mouth daily.  10/04/13  Yes Corky Crafts, MD  doxazosin (CARDURA) 2 MG tablet TAKE 1 TABLET BY MOUTH DAILY. Patient taking differently: Take 2 mg by mouth daily.  04/21/14  Yes Corky Crafts, MD  metoprolol succinate (TOPROL-XL) 100 MG 24 hr tablet Take 100 mg by mouth daily.  01/16/13  Yes [provider]  simvastatin (ZOCOR) 20 MG tablet Take 20 mg by mouth daily.  01/07/13  Yes [provider]  Vitamin D, Ergocalciferol, (DRISDOL) 1.25 MG (50000 UNIT) CAPS capsule Take 50,000 Units by mouth every Monday. 11/18/19  Yes [provider]    Inpatient Medications: Scheduled Meds: . sodium chloride   Intravenous Once  . amLODipine  10 mg Oral Daily  . Chlorhexidine Gluconate Cloth  6 each Topical Daily  . cloNIDine  0.2 mg Oral BID  . doxazosin  2 mg Oral Daily  . hydrALAZINE  25 mg Oral TID  . insulin aspart  0-5 Units Subcutaneous QHS  . insulin aspart  0-6 Units Subcutaneous TID WC  . sodium chloride flush  3 mL Intravenous  Q12H   Continuous Infusions: . sodium chloride 10 mL/hr at 12/23/19 0700  . niCARDipine 7.5 mg/hr (12/23/19 0847)   PRN Meds: acetaminophen **OR** acetaminophen  Allergies:    Allergies  Allergen Reactions  . Ace Inhibitors Cough    Social History:   Social History   Socioeconomic History  . Marital status: Divorced    Spouse name: Not on file  . Number of children: Not on file  . Years of education: Not on file  . Highest education level: Not on file  Occupational History  . Not on file  Tobacco  Use  . Smoking status: Never Smoker  . Smokeless tobacco: Never Used  Substance and Sexual Activity  . Alcohol use: Not on file  . Drug use: Not on file  . Sexual activity: Not on file  Other Topics Concern  . Not on file  Social History Narrative  . Not on file   Social Determinants of Health   Financial Resource Strain:   . Difficulty of Paying Living Expenses: Not on file  Food Insecurity:   . Worried About Programme researcher, broadcasting/film/video in the Last Year: Not on file  . Ran Out of Food in the Last Year: Not on file  Transportation Needs:   . Lack of Transportation (Medical): Not on file  . Lack of Transportation (Non-Medical): Not on file  Physical Activity:   . Days of Exercise per Week: Not on file  . Minutes of Exercise per Session: Not on file  Stress:   . Feeling of Stress : Not on file  Social Connections:   . Frequency of Communication with Friends and Family: Not on file  . Frequency of Social Gatherings with Friends and Family: Not on file  . Attends Religious Services: Not on file  . Active Member of Clubs or Organizations: Not on file  . Attends Banker Meetings: Not on file  . Marital Status: Not on file  Intimate Partner Violence:   . Fear of Current or Ex-Partner: Not on file  . Emotionally Abused: Not on file  . Physically Abused: Not on file  . Sexually Abused: Not on file    Family History:  Family History  Problem Relation Age of Onset  . Hypertension Sister      ROS:  Please see the history of present illness.  ROS  - All other ROS reviewed and negative.     Physical Exam/Data:   Vitals:   12/23/19 0700 12/23/19 0800 12/23/19 0855 12/23/19 1200  BP: (!) 149/57 (!) 144/61 (!) 156/57   Pulse: 71 73    Resp: 15 15    Temp:  99.3 F (37.4 C)  98.8 F (37.1 C)  TempSrc:  Oral  Oral  SpO2: 98% 96%    Weight:      Height:        Intake/Output Summary (Last 24 hours) at 12/23/2019 1253 Last data filed at 12/23/2019 0856 Gross per 24  hour  Intake 698.99 ml  Output --  Net 698.99 ml   Filed Weights   12/21/19 0839  Weight: 50.8 kg   Body mass index is 21.16 kg/m.  General:  Well nourished, well developed, in no acute distress HEENT: normal Lymph: no adenopathy Neck: postive external JVD Endocrine:  No thryomegaly Vascular: No carotid bruits; FA pulses 2+ bilaterally without bruits  Cardiac:  normal S1, S2; RRR; MR murmur heard at the left lower sternal boarder.  Lungs:  clear to auscultation bilaterally,  no wheezing, rhonchi or rales  Abd: soft, nontender, no hepatomegaly  Ext: no LE edema Musculoskeletal:  No deformities, BUE and BLE strength normal and equal Skin: warm and dry  Neuro:  Cns grossly 2-12 intact, no focal abnormalities noted Psych:  Normal affect   EKG:  The EKG was personally reviewed and demonstrates:  Sinus Rhythm Telemetry:  Telemetry was personally reviewed and demonstrates:  Sinus Rhythm   Relevant CV Studies: Echocardiogram (12/22/19)  1. Left ventricular ejection fraction, by estimation, is 60 to 65%. The  left ventricle has normal function. The left ventricle has no regional  wall motion abnormalities. There is severe concentric left ventricular  hypertrophy. Left ventricular diastolic   parameters are consistent with Grade II diastolic dysfunction  (pseudonormalization). Elevated left ventricular end-diastolic pressure.   2. Right ventricular systolic function is normal. The right ventricular  size is normal. There is mildly elevated pulmonary artery systolic  pressure. The estimated right ventricular systolic pressure is 37.8 mmHg.   3. Left atrial size was severely dilated.   4. A small pericardial effusion is present. The pericardial effusion is  circumferential.   5. The mitral valve is degenerative. Mild mitral valve regurgitation. No  evidence of mitral stenosis.   6. The aortic valve is tricuspid. Aortic valve regurgitation is trivial.  Mild aortic valve sclerosis is  present, with no evidence of aortic valve  stenosis.   7. The inferior vena cava is dilated in size with >50% respiratory  variability, suggesting right atrial pressure of 8 mmHg.   8. The LV myocardium is severly thickened with a speckeled pattern. In  the setting of pericardial effusion and severe LA enlargement, would  consider possible amyloidosis. Consider PYP scan or cardiac MRI if  clinically indicated.   Laboratory Data:  Chemistry Recent Labs  Lab 12/21/19 0930 12/22/19 0225 12/23/19 0632  NA 145 145 143  K 4.3 4.4 4.7  CL 113* 115* 115*  CO2 21* 21* 20*  GLUCOSE 93 115* 102*  BUN 52* 50* 53*  CREATININE 5.54* 5.48* 5.81*  CALCIUM 8.5* 8.1* 8.0*  GFRNONAA 7* 7* 7*  ANIONGAP 11 9 8     Recent Labs  Lab 12/22/19 0225 12/22/19 1428  PROT  --  4.4*  ALBUMIN 2.1* 2.0*  AST  --  15  ALT  --  11  ALKPHOS  --  34*  BILITOT  --  0.3   Hematology Recent Labs  Lab 12/21/19 0930 12/23/19 0632  WBC 5.8 8.6  RBC 3.10* 2.28*  HGB 8.3* 6.2*  HCT 26.6* 19.5*  MCV 85.8 85.5  MCH 26.8 27.2  MCHC 31.2 31.8  RDW 15.1 15.2  PLT 278 248   Cardiac EnzymesNo results for input(s): TROPONINI in the last 168 hours. No results for input(s): TROPIPOC in the last 168 hours.  BNPNo results for input(s): BNP, PROBNP in the last 168 hours.  DDimer No results for input(s): DDIMER in the last 168 hours.  Radiology/Studies:  DG Chest 1 View  Result Date: 12/22/2019 CLINICAL DATA:  Status post left thoracentesis. EXAM: CHEST  1 VIEW COMPARISON:  December 22, 2019 FINDINGS: Enlarged cardiac silhouette. Decreased left pleural effusion. Mild streaky airspace opacities in the left lung base. No radiographically apparent pneumothorax. Osseous structures are without acute abnormality. Soft tissues are grossly normal. IMPRESSION: 1. No radiographically apparent pneumothorax. 2. Decreased left pleural effusion. 3. Mild streaky airspace opacities in the left lung base. Electronically Signed    By: Ulanda Edisonobrinka  Dimitrova M.D.  On: 12/22/2019 13:29   US RENAL  Result Date: 12/21/2019 CLINICAL DATA:  Acute kidney injury EXAM: RENAL / URINARY TRACT ULTRASOUND COMPLETE COMPARISON:  None. FINDINGS: Right Kidney: Renal measurements: 7.2 x 3.4 x 3.8 cm = volume: 48 mL. There is increased cortical echogenicity. There appears to be mild collecting system dilatation. Left Kidney: Renal measurements: 7.7 x 4.4 x 3.7 cm = volume: 66 mL. There is increased cortical echogenicity with mild collecting system dilatation. Bladder: The ureteral jets were not visualized. Other: Incidentally noted are bilateral pleural effusions that appear to be at least moderate if not large in size. IMPRESSION: 1. Small, echogenic kidneys bilaterally which can be seen in patients with medical renal disease. 2. Mild bilateral collecting system dilatation. 3. The ureteral jets were not visualized. 4. Incidentally noted moderate if not large bilateral pleural effusions. Electronically Signed   By: Katherine Mantle M.D.   On: 12/21/2019 19:05   DG CHEST PORT 1 VIEW  Result Date: 12/22/2019 CLINICAL DATA:  Pleural effusion EXAM: PORTABLE CHEST 1 VIEW COMPARISON:  None. FINDINGS: There is a fairly small left pleural effusion with left base atelectasis. There is a subtle area of ill-defined opacity in the right base. Lungs otherwise are clear. Heart is mildly enlarged with pulmonary vascularity normal. No adenopathy. No bone lesions. IMPRESSION: Small left pleural effusion with left base atelectasis. Focal area of opacity right base concerning for developing pneumonia. Mild cardiomegaly. No adenopathy. Electronically Signed   By: Bretta Bang III M.D.   On: 12/22/2019 11:21   ECHOCARDIOGRAM COMPLETE  Result Date: 12/22/2019    ECHOCARDIOGRAM REPORT   Patient Name:   Valerie Bonilla Date of Exam: 12/22/2019 Medical Rec #:  [id number]       Height:       61.0 in Accession #:    [id number]      Weight:       112.0 lb Date of Birth:   11-17-1934       BSA:          1.476 m Patient Age:    85 years        BP:           153/61 mmHg Patient Gender: F               HR:           63 bpm. Exam Location:  Inpatient Procedure: 2D Echo, Cardiac Doppler and Color Doppler Indications:    Pleural effusion  History:        Patient has prior history of Echocardiogram examinations, most                 recent 03/16/2010. Risk Factors:Hypertension, Diabetes and                 Dyslipidemia. CKD.  Sonographer:    Ross Ludwig RDCS (AE) Referring Phys: [phone] DAVID GIRGUIS IMPRESSIONS  1. Left ventricular ejection fraction, by estimation, is 60 to 65%. The left ventricle has normal function. The left ventricle has no regional wall motion abnormalities. There is severe concentric left ventricular hypertrophy. Left ventricular diastolic  parameters are consistent with Grade II diastolic dysfunction (pseudonormalization). Elevated left ventricular end-diastolic pressure.  2. Right ventricular systolic function is normal. The right ventricular size is normal. There is mildly elevated pulmonary artery systolic pressure. The estimated right ventricular systolic pressure is 37.8 mmHg.  3. Left atrial size was severely dilated.  4. A small pericardial effusion is present. The pericardial  effusion is circumferential.  5. The mitral valve is degenerative. Mild mitral valve regurgitation. No evidence of mitral stenosis.  6. The aortic valve is tricuspid. Aortic valve regurgitation is trivial. Mild aortic valve sclerosis is present, with no evidence of aortic valve stenosis.  7. The inferior vena cava is dilated in size with >50% respiratory variability, suggesting right atrial pressure of 8 mmHg.  8. The LV myocardium is severly thickened with a speckeled pattern. In the setting of pericardial effusion and severe LA enlargement, would consider possible amyloidosis. Consider PYP scan or cardiac MRI if clinically indicated. FINDINGS  Left Ventricle: Left ventricular ejection  fraction, by estimation, is 60 to 65%. The left ventricle has normal function. The left ventricle has no regional wall motion abnormalities. The left ventricular internal cavity size was normal in size. There is  severe concentric left ventricular hypertrophy. Left ventricular diastolic parameters are consistent with Grade II diastolic dysfunction (pseudonormalization). Elevated left ventricular end-diastolic pressure. Right Ventricle: The right ventricular size is normal. No increase in right ventricular wall thickness. Right ventricular systolic function is normal. There is mildly elevated pulmonary artery systolic pressure. The tricuspid regurgitant velocity is 2.73  m/s, and with an assumed right atrial pressure of 8 mmHg, the estimated right ventricular systolic pressure is 37.8 mmHg. Left Atrium: Left atrial size was severely dilated. Right Atrium: Right atrial size was normal in size. Pericardium: A small pericardial effusion is present. The pericardial effusion is circumferential. Mitral Valve: The mitral valve is degenerative in appearance. There is mild thickening of the mitral valve leaflet(s). Mild mitral annular calcification. Mild mitral valve regurgitation. No evidence of mitral valve stenosis. MV peak gradient, 6.4 mmHg. The mean mitral valve gradient is 2.0 mmHg. Tricuspid Valve: The tricuspid valve is normal in structure. Tricuspid valve regurgitation is trivial. No evidence of tricuspid stenosis. Aortic Valve: The aortic valve is tricuspid. Aortic valve regurgitation is trivial. Mild aortic valve sclerosis is present, with no evidence of aortic valve stenosis. Aortic valve mean gradient measures 5.0 mmHg. Aortic valve peak gradient measures 8.4 mmHg. Aortic valve area, by VTI measures 1.85 cm. Pulmonic Valve: The pulmonic valve was normal in structure. Pulmonic valve regurgitation is not visualized. No evidence of pulmonic stenosis. Aorta: The aortic root is normal in size and structure. Venous:  The inferior vena cava is dilated in size with greater than 50% respiratory variability, suggesting right atrial pressure of 8 mmHg. IAS/Shunts: No atrial level shunt detected by color flow Doppler.  LEFT VENTRICLE PLAX 2D LVIDd:         4.20 cm  Diastology LVIDs:         2.20 cm  LV e' medial:    6.08 cm/s LV PW:         1.60 cm  LV E/e' medial:  17.4 LV IVS:        1.80 cm  LV e' lateral:   6.83 cm/s LVOT diam:     1.60 cm  LV E/e' lateral: 15.5 LV SV:         73 LV SV Index:   49 LVOT Area:     2.01 cm  RIGHT VENTRICLE            IVC RV Basal diam:  3.00 cm    IVC diam: 2.10 cm RV S prime:     9.30 cm/s TAPSE (M-mode): 2.3 cm LEFT ATRIUM              Index  RIGHT ATRIUM           Index LA diam:        3.60 cm  2.44 cm/m  RA Area:     15.10 cm LA Vol (A2C):   96.4 ml  65.30 ml/m RA Volume:   36.20 ml  24.52 ml/m LA Vol (A4C):   102.0 ml 69.09 ml/m LA Biplane Vol: 103.0 ml 69.77 ml/m  AORTIC VALVE AV Area (Vmax):    1.90 cm AV Area (Vmean):   1.63 cm AV Area (VTI):     1.85 cm AV Vmax:           145.00 cm/s AV Vmean:          107.000 cm/s AV VTI:            0.393 m AV Peak Grad:      8.4 mmHg AV Mean Grad:      5.0 mmHg LVOT Vmax:         137.00 cm/s LVOT Vmean:        86.600 cm/s LVOT VTI:          0.361 m LVOT/AV VTI ratio: 0.92  AORTA Ao Root diam: 2.80 cm Ao Asc diam:  2.00 cm MITRAL VALVE                TRICUSPID VALVE MV Area (PHT): 3.77 cm     TR Peak grad:   29.8 mmHg MV Peak grad:  6.4 mmHg     TR Vmax:        273.00 cm/s MV Mean grad:  2.0 mmHg MV Vmax:       1.26 m/s     SHUNTS MV Vmean:      60.6 cm/s    Systemic VTI:  0.36 m MV Decel Time: 201 msec     Systemic Diam: 1.60 cm MV E velocity: 106.00 cm/s MV A velocity: 53.60 cm/s MV E/A ratio:  1.98 Armanda Magic MD Electronically signed by Armanda Magic MD Signature Date/Time: 12/22/2019/1:37:09 PM    Final    US THORACENTESIS ASP PLEURAL SPACE W/IMG GUIDE  Result Date: 12/22/2019 INDICATION: Progressive renal failure with fluid  overload and bilateral pleural effusions. Request for diagnostic and therapeutic thoracentesis. EXAM: ULTRASOUND GUIDED LEFT THORACENTESIS MEDICATIONS: 1% lidocaine 10 mL COMPLICATIONS: None immediate. PROCEDURE: An ultrasound guided thoracentesis was thoroughly discussed with the patient and questions answered. The benefits, risks, alternatives and complications were also discussed. The patient understands and wishes to proceed with the procedure. Written consent was obtained. Ultrasound was performed to localize and mark an adequate pocket of fluid in the left chest. The area was then prepped and draped in the normal sterile fashion. 1% Lidocaine was used for local anesthesia. Under ultrasound guidance a 6 Fr Safe-T-Centesis catheter was introduced. Thoracentesis was performed. The catheter was removed and a dressing applied. FINDINGS: A total of approximately 800 mL of clear yellow fluid was removed. Samples were sent to the laboratory as requested by the clinical team. IMPRESSION: Successful ultrasound guided left thoracentesis yielding 800 mL of pleural fluid. Read by: Corrin Parker, PA-C Electronically Signed   By: Irish Lack M.D.   On: 12/22/2019 12:38    Assessment and Plan:   1. Heart Failure with preserved ejection fractions with LVH:  Patient presents with a roughly 2 week history of shortness of breath, but otherwise asymptomatic. Echo shows preserved EF of 60-65% with grade II diastolic dysfunction, severely dilated left atrium. Small circumferential pericardial effusion. There is  mild mitral valve regurgitation without stenosis. There is mild aortic sclerosis. The LV myocardium is severely thickened with speckled pattern. Although patients hypertrophy could be secondary to uncontrolled HTN, her echo warrants further evaluation for cardiac amyloidosis.  - Will perform PYP study tomorrow.   2. HTN Emergency: - On home medications - amlodipine 10 mg, Clonidine 0.2 mg BID, doxazosin 2 mg  daily - Additionally she is on hydralazine 25 mg TID. Nicardipine gtt d/c'd on 11/13 - Blood pressure is normotensive today.  3. IDA: - Patient has an acute drop in Hgb form 8.3>6.2 - Iron studies shows ferritin of 66, iron of 24, and saturation ratio of 12% . - Currently getting blood transfusion  - FOBT pending - No obvious source of bleeding  4, Acute respiratory failure with hypoxia: - left pleural effusion s/p thoracentesis 11/14 with 800 cc removed - Fluid analysis shows exudative effusion.  - Management per primary team  5. Acute on chronic kidney failure: - Patient presented with acute decompensation of kidney function in the setting of HTN emergency. - Nephrology currently managing. Pending work up for paraproteinemia.    For questions or updates, please contact [facility] Please consult [email or web address] for contact info under Cardiology/STEMI.   Signed, Dellia Cloud, MD  12/23/2019 12:53 PM  Pager: [phone]  Patient examined chart reviewed Discussed care with resident. Exam with frail elderly black female getting transfusion. BP improved. Apical MR murmur. Abdomen soft trace edema palpable pedal pulses. 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 Septal thickness 18 mm with speckle pattern small pericardial effusion. Pseudo normal diastolic function and severe LAE. However her biggest issues are A/CRF and severe anemia with HB as low as 6.2. Will order PYP nuclear study tomorrow since she is in house. However Rx with talfamadis is not likely indicated as she has not had recurrent CHF, and has severe renal failure Cost of this drug is also always an issue Tafamidis is contraindicated with GFR < 25 ml/min and hers is 7 Further w/u of renal failure pending including myeloma/renal amyloid. Biopsy appropriately deferred until Hb improved and stable. Will defer to nephrology regarding Rx of renovascular HTN and use of high dose diuretics to maintain urine output and prevent  anuria. Patient is not sure she wants dialysis and this will have to be discussed further by nephrology   Charlton Haws MD [facility]

## 2019-12-23 NOTE — Assessment & Plan Note (Signed)
-   echo obtained due to uncontrolled BP - EF 60-65%, Gr II DD, severe LVH, LA dilated; LV myocardium noted to be "severely thickened with a speckled pattern" - due to underlying rapid renal failure with increased protein, unsure if the two could be related to an underlying amyloid process? - cardiology consulted to see if needs further imaging (PYP scan vs MRI) - check BNP; no obvious overload/edema in extremities but CXR on 11/14 had left pleural effusion s/p 800 cc from thoracentesis and echo also mentions small pericardial effusion

## 2019-12-23 NOTE — Progress Notes (Signed)
Renal artery duplex completed. Refer to "CV Proc" under chart review to view preliminary results.  12/23/2019 10:09 AM Eula Fried., MHA, RVT, RDCS, RDMS

## 2019-12-23 NOTE — Assessment & Plan Note (Signed)
-   ferritin 66, iron 24, sat ratio 12% - s/p nulecit started 250mg  daily on 11/14 - no obvious bleeding or large risk factors but Hgb has downtrended 8.3>>6.2 g/dL on morning of 12/14 - no prior transfusions received in her life she says; no hx ulcers etc; does not use NSAIDs (very rare) - reports brown stools; will check FOBT on next stool - transfuse 1 unit PRBC today and recheck H/H 1 hour after

## 2019-12-24 ENCOUNTER — Inpatient Hospital Stay (HOSPITAL_COMMUNITY): Payer: Medicare Other

## 2019-12-24 ENCOUNTER — Other Ambulatory Visit (HOSPITAL_COMMUNITY): Payer: Medicare Other

## 2019-12-24 DIAGNOSIS — I161 Hypertensive emergency: Secondary | ICD-10-CM | POA: Diagnosis not present

## 2019-12-24 DIAGNOSIS — I5031 Acute diastolic (congestive) heart failure: Secondary | ICD-10-CM | POA: Diagnosis not present

## 2019-12-24 LAB — TYPE AND SCREEN
ABO/RH(D): O POS
Antibody Screen: NEGATIVE
Unit division: 0

## 2019-12-24 LAB — BASIC METABOLIC PANEL
Anion gap: 12 (ref 5–15)
BUN: 56 mg/dL — ABNORMAL HIGH (ref 8–23)
CO2: 17 mmol/L — ABNORMAL LOW (ref 22–32)
Calcium: 8.3 mg/dL — ABNORMAL LOW (ref 8.9–10.3)
Chloride: 115 mmol/L — ABNORMAL HIGH (ref 98–111)
Creatinine, Ser: 6.07 mg/dL — ABNORMAL HIGH (ref 0.44–1.00)
GFR, Estimated: 6 mL/min — ABNORMAL LOW (ref >60)
Glucose, Bld: 113 mg/dL — ABNORMAL HIGH (ref 70–99)
Potassium: 4.7 mmol/L (ref 3.5–5.1)
Sodium: 144 mmol/L (ref 135–145)

## 2019-12-24 LAB — CBC WITH DIFFERENTIAL/PLATELET
Abs Immature Granulocytes: 0.05 10*3/uL (ref 0.00–0.07)
Basophils Absolute: 0 10*3/uL (ref 0.0–0.1)
Basophils Relative: 0 %
Eosinophils Absolute: 0.3 10*3/uL (ref 0.0–0.5)
Eosinophils Relative: 3 %
HCT: 26 % — ABNORMAL LOW (ref 36.0–46.0)
Hemoglobin: 8.4 g/dL — ABNORMAL LOW (ref 12.0–15.0)
Immature Granulocytes: 1 %
Lymphocytes Relative: 9 %
Lymphs Abs: 0.9 10*3/uL (ref 0.7–4.0)
MCH: 27.7 pg (ref 26.0–34.0)
MCHC: 32.3 g/dL (ref 30.0–36.0)
MCV: 85.8 fL (ref 80.0–100.0)
Monocytes Absolute: 0.8 10*3/uL (ref 0.1–1.0)
Monocytes Relative: 8 %
Neutro Abs: 8 10*3/uL — ABNORMAL HIGH (ref 1.7–7.7)
Neutrophils Relative %: 79 %
Platelets: 238 10*3/uL (ref 150–400)
RBC: 3.03 MIL/uL — ABNORMAL LOW (ref 3.87–5.11)
RDW: 15.1 % (ref 11.5–15.5)
WBC: 10 10*3/uL (ref 4.0–10.5)
nRBC: 0 % (ref 0.0–0.2)

## 2019-12-24 LAB — GLUCOSE, CAPILLARY
Glucose-Capillary: 89 mg/dL (ref 70–99)
Glucose-Capillary: 131 mg/dL — ABNORMAL HIGH (ref 70–99)
Glucose-Capillary: 139 mg/dL — ABNORMAL HIGH (ref 70–99)

## 2019-12-24 LAB — PHOSPHORUS: Phosphorus: 5.5 mg/dL — ABNORMAL HIGH (ref 2.5–4.6)

## 2019-12-24 LAB — CYTOLOGY - NON PAP

## 2019-12-24 LAB — PH, BODY FLUID: pH, Body Fluid: 7.5

## 2019-12-24 LAB — URINE CULTURE: Culture: 30000 — AB

## 2019-12-24 LAB — PROTIME-INR
INR: 1.1 (ref 0.8–1.2)
Prothrombin Time: 14.1 seconds (ref 11.4–15.2)

## 2019-12-24 LAB — BPAM RBC
Blood Product Expiration Date: 202112172359
ISSUE DATE / TIME: 202111151358
Unit Type and Rh: 5100

## 2019-12-24 LAB — GLOMERULAR BASEMENT MEMBRANE ANTIBODIES: GBM Ab: 3 units (ref 0–20)

## 2019-12-24 LAB — MAGNESIUM: Magnesium: 1.7 mg/dL (ref 1.7–2.4)

## 2019-12-24 MED ORDER — PANTOPRAZOLE SODIUM 40 MG PO TBEC
40.0000 mg | DELAYED_RELEASE_TABLET | Freq: Every day | ORAL | Status: DC
  Administered 2019-12-24 – 2019-12-26 (×3): 40 mg via ORAL
  Filled 2019-12-24 (×3): qty 1

## 2019-12-24 MED ORDER — TECHNETIUM TC 99M PYROPHOSPHATE
21.5000 | Freq: Once | INTRAVENOUS | Status: AC | PRN
  Administered 2019-12-24: 21.5 via INTRAVENOUS
  Filled 2019-12-24: qty 22

## 2019-12-24 MED ORDER — DOXAZOSIN MESYLATE 8 MG PO TABS
8.0000 mg | ORAL_TABLET | Freq: Every day | ORAL | Status: DC
  Administered 2019-12-24 – 2019-12-26 (×3): 8 mg via ORAL
  Filled 2019-12-24 (×3): qty 1

## 2019-12-24 MED ORDER — HYDRALAZINE HCL 50 MG PO TABS
100.0000 mg | ORAL_TABLET | Freq: Three times a day (TID) | ORAL | Status: DC
  Administered 2019-12-24 – 2019-12-26 (×7): 100 mg via ORAL
  Filled 2019-12-24 (×7): qty 2

## 2019-12-24 MED ORDER — DARBEPOETIN ALFA 200 MCG/0.4ML IJ SOSY
200.0000 ug | PREFILLED_SYRINGE | INTRAMUSCULAR | Status: DC
  Administered 2019-12-24: 200 ug via SUBCUTANEOUS
  Filled 2019-12-24: qty 0.4

## 2019-12-24 NOTE — Progress Notes (Signed)
Back in NSR 

## 2019-12-24 NOTE — Progress Notes (Signed)
Pt went into a fib upon returning from[place]. Current rate 130-150. MD Dahal paged to notify.

## 2019-12-24 NOTE — Progress Notes (Addendum)
Progress Note  Patient Name: Valerie LessenFannie B Bonilla Date of Encounter: 12/24/2019  Primary Cardiologist: No primary care provider on file.   Subjective   Patient resting comfortably in bed. She states that she is feeling much better since getting the blood transfusion.   Inpatient Medications    Scheduled Meds: . amLODipine  10 mg Oral Daily  . Chlorhexidine Gluconate Cloth  6 each Topical Daily  . cloNIDine  0.2 mg Oral BID  . doxazosin  2 mg Oral Daily  . hydrALAZINE  25 mg Oral TID  . insulin aspart  0-5 Units Subcutaneous QHS  . insulin aspart  0-6 Units Subcutaneous TID WC  . sodium chloride flush  3 mL Intravenous Q12H   Continuous Infusions: . sodium chloride 10 mL/hr at 12/24/19 0600  . niCARDipine 5 mg/hr (12/24/19 0600)   PRN Meds: acetaminophen **OR** acetaminophen, ipratropium-albuterol   Vital Signs    Vitals:   12/24/19 0400 12/24/19 0425 12/24/19 0500 12/24/19 0600  BP: (!) 169/78  (!) 145/59 (!) 155/70  Pulse: 71  66 70  Resp: (!) 24  13 (!) 22  Temp:  98.6 F (37 C)    TempSrc:  Oral    SpO2: 97%  96% 98%  Weight:      Height:        Intake/Output Summary (Last 24 hours) at 12/24/2019 [id number] Last data filed at 12/24/2019 0600 Gross per 24 hour  Intake 1413.82 ml  Output 550 ml  Net 863.82 ml   Filed Weights   12/21/19 0839  Weight: 50.8 kg    Telemetry     Sinus rhythm - Personally Reviewed  ECG    Not performed today - Personally Reviewed  Physical Exam   GEN: No acute distress.   Neck: mild positive elevation in external JVD Cardiac: RRR, MR murmur heard at the left lower sternal boarder.  Respiratory: Clear to auscultation bilaterally. GI: Soft, nontender, non-distended  MS: No edema; No deformity. Neuro:  Nonfocal  Psych: Normal affect   Labs    Chemistry Recent Labs  Lab 12/22/19 0225 12/22/19 1428 12/23/19 0632 12/24/19 0358  NA 145  --  143 144  K 4.4  --  4.7 4.7  CL 115*  --  115* 115*  CO2 21*  --  20* 17*    GLUCOSE 115*  --  102* 113*  BUN 50*  --  53* 56*  CREATININE 5.48*  --  5.81* 6.07*  CALCIUM 8.1*  --  8.0* 8.3*  PROT  --  4.4*  --   --   ALBUMIN 2.1* 2.0*  --   --   AST  --  15  --   --   ALT  --  11  --   --   ALKPHOS  --  34*  --   --   BILITOT  --  0.3  --   --   GFRNONAA 7*  --  7* 6*  ANIONGAP 9  --  8 12     Hematology Recent Labs  Lab 12/23/19 0632 12/23/19 2123 12/24/19 0358  WBC 8.6 12.7* 10.0  RBC 2.28* 3.13* 3.03*  HGB 6.2* 8.7* 8.4*  HCT 19.5* 26.5* 26.0*  MCV 85.5 84.7 85.8  MCH 27.2 27.8 27.7  MCHC 31.8 32.8 32.3  RDW 15.2 14.8 15.1  PLT 248 248 238    Cardiac EnzymesNo results for input(s): TROPONINI in the last 168 hours. No results for input(s): TROPIPOC in the last 168  hours.   BNP Recent Labs  Lab 12/23/19 2123  BNP 523.9*     DDimer No results for input(s): DDIMER in the last 168 hours.   Radiology    DG Chest 1 View  Result Date: 12/22/2019 CLINICAL DATA:  Status post left thoracentesis. EXAM: CHEST  1 VIEW COMPARISON:  December 22, 2019 FINDINGS: Enlarged cardiac silhouette. Decreased left pleural effusion. Mild streaky airspace opacities in the left lung base. No radiographically apparent pneumothorax. Osseous structures are without acute abnormality. Soft tissues are grossly normal. IMPRESSION: 1. No radiographically apparent pneumothorax. 2. Decreased left pleural effusion. 3. Mild streaky airspace opacities in the left lung base. Electronically Signed   By: Ted Mcalpine M.D.   On: 12/22/2019 13:29   DG CHEST PORT 1 VIEW  Result Date: 12/22/2019 CLINICAL DATA:  Pleural effusion EXAM: PORTABLE CHEST 1 VIEW COMPARISON:  None. FINDINGS: There is a fairly small left pleural effusion with left base atelectasis. There is a subtle area of ill-defined opacity in the right base. Lungs otherwise are clear. Heart is mildly enlarged with pulmonary vascularity normal. No adenopathy. No bone lesions. IMPRESSION: Small left pleural effusion  with left base atelectasis. Focal area of opacity right base concerning for developing pneumonia. Mild cardiomegaly. No adenopathy. Electronically Signed   By: Bretta Bang III M.D.   On: 12/22/2019 11:21   ECHOCARDIOGRAM COMPLETE  Result Date: 12/22/2019    ECHOCARDIOGRAM REPORT   Patient Name:   Valerie Bonilla Date of Exam: 12/22/2019 Medical Rec #:  [id number]       Height:       61.0 in Accession #:    [id number]      Weight:       112.0 lb Date of Birth:  [birth year]       BSA:          1.476 m Patient Age:    85 years        BP:           153/61 mmHg Patient Gender: F               HR:           63 bpm. Exam Location:  Inpatient Procedure: 2D Echo, Cardiac Doppler and Color Doppler Indications:    Pleural effusion  History:        Patient has prior history of Echocardiogram examinations, most                 recent 03/16/2010. Risk Factors:Hypertension, Diabetes and                 Dyslipidemia. CKD.  Sonographer:    Ross Ludwig RDCS (AE) Referring Phys: [phone] DAVID GIRGUIS IMPRESSIONS  1. Left ventricular ejection fraction, by estimation, is 60 to 65%. The left ventricle has normal function. The left ventricle has no regional wall motion abnormalities. There is severe concentric left ventricular hypertrophy. Left ventricular diastolic  parameters are consistent with Grade II diastolic dysfunction (pseudonormalization). Elevated left ventricular end-diastolic pressure.  2. Right ventricular systolic function is normal. The right ventricular size is normal. There is mildly elevated pulmonary artery systolic pressure. The estimated right ventricular systolic pressure is 37.8 mmHg.  3. Left atrial size was severely dilated.  4. A small pericardial effusion is present. The pericardial effusion is circumferential.  5. The mitral valve is degenerative. Mild mitral valve regurgitation. No evidence of mitral stenosis.  6. The aortic valve is tricuspid. Aortic valve regurgitation is trivial. Mild  aortic valve  sclerosis is present, with no evidence of aortic valve stenosis.  7. The inferior vena cava is dilated in size with >50% respiratory variability, suggesting right atrial pressure of 8 mmHg.  8. The LV myocardium is severly thickened with a speckeled pattern. In the setting of pericardial effusion and severe LA enlargement, would consider possible amyloidosis. Consider PYP scan or cardiac MRI if clinically indicated. FINDINGS  Left Ventricle: Left ventricular ejection fraction, by estimation, is 60 to 65%. The left ventricle has normal function. The left ventricle has no regional wall motion abnormalities. The left ventricular internal cavity size was normal in size. There is  severe concentric left ventricular hypertrophy. Left ventricular diastolic parameters are consistent with Grade II diastolic dysfunction (pseudonormalization). Elevated left ventricular end-diastolic pressure. Right Ventricle: The right ventricular size is normal. No increase in right ventricular wall thickness. Right ventricular systolic function is normal. There is mildly elevated pulmonary artery systolic pressure. The tricuspid regurgitant velocity is 2.73  m/s, and with an assumed right atrial pressure of 8 mmHg, the estimated right ventricular systolic pressure is 37.8 mmHg. Left Atrium: Left atrial size was severely dilated. Right Atrium: Right atrial size was normal in size. Pericardium: A small pericardial effusion is present. The pericardial effusion is circumferential. Mitral Valve: The mitral valve is degenerative in appearance. There is mild thickening of the mitral valve leaflet(s). Mild mitral annular calcification. Mild mitral valve regurgitation. No evidence of mitral valve stenosis. MV peak gradient, 6.4 mmHg. The mean mitral valve gradient is 2.0 mmHg. Tricuspid Valve: The tricuspid valve is normal in structure. Tricuspid valve regurgitation is trivial. No evidence of tricuspid stenosis. Aortic Valve: The aortic valve is  tricuspid. Aortic valve regurgitation is trivial. Mild aortic valve sclerosis is present, with no evidence of aortic valve stenosis. Aortic valve mean gradient measures 5.0 mmHg. Aortic valve peak gradient measures 8.4 mmHg. Aortic valve area, by VTI measures 1.85 cm. Pulmonic Valve: The pulmonic valve was normal in structure. Pulmonic valve regurgitation is not visualized. No evidence of pulmonic stenosis. Aorta: The aortic root is normal in size and structure. Venous: The inferior vena cava is dilated in size with greater than 50% respiratory variability, suggesting right atrial pressure of 8 mmHg. IAS/Shunts: No atrial level shunt detected by color flow Doppler.  LEFT VENTRICLE PLAX 2D LVIDd:         4.20 cm  Diastology LVIDs:         2.20 cm  LV e' medial:    6.08 cm/s LV PW:         1.60 cm  LV E/e' medial:  17.4 LV IVS:        1.80 cm  LV e' lateral:   6.83 cm/s LVOT diam:     1.60 cm  LV E/e' lateral: 15.5 LV SV:         73 LV SV Index:   49 LVOT Area:     2.01 cm  RIGHT VENTRICLE            IVC RV Basal diam:  3.00 cm    IVC diam: 2.10 cm RV S prime:     9.30 cm/s TAPSE (M-mode): 2.3 cm LEFT ATRIUM              Index       RIGHT ATRIUM           Index LA diam:        3.60 cm  2.44 cm/m  RA Area:  15.10 cm LA Vol (A2C):   96.4 ml  65.30 ml/m RA Volume:   36.20 ml  24.52 ml/m LA Vol (A4C):   102.0 ml 69.09 ml/m LA Biplane Vol: 103.0 ml 69.77 ml/m  AORTIC VALVE AV Area (Vmax):    1.90 cm AV Area (Vmean):   1.63 cm AV Area (VTI):     1.85 cm AV Vmax:           145.00 cm/s AV Vmean:          107.000 cm/s AV VTI:            0.393 m AV Peak Grad:      8.4 mmHg AV Mean Grad:      5.0 mmHg LVOT Vmax:         137.00 cm/s LVOT Vmean:        86.600 cm/s LVOT VTI:          0.361 m LVOT/AV VTI ratio: 0.92  AORTA Ao Root diam: 2.80 cm Ao Asc diam:  2.00 cm MITRAL VALVE                TRICUSPID VALVE MV Area (PHT): 3.77 cm     TR Peak grad:   29.8 mmHg MV Peak grad:  6.4 mmHg     TR Vmax:        273.00 cm/s  MV Mean grad:  2.0 mmHg MV Vmax:       1.26 m/s     SHUNTS MV Vmean:      60.6 cm/s    Systemic VTI:  0.36 m MV Decel Time: 201 msec     Systemic Diam: 1.60 cm MV E velocity: 106.00 cm/s MV A velocity: 53.60 cm/s MV E/A ratio:  1.98 Armanda Magic MD Electronically signed by Armanda Magic MD Signature Date/Time: 12/22/2019/1:37:09 PM    Final    VAS US RENAL ARTERY DUPLEX  Result Date: 12/23/2019 ABDOMINAL VISCERAL Indications: Hypertension, AKI High Risk Factors: Hypertension, hyperlipidemia, Diabetes. Limitations: Body habitus. Comparison Study: No prior study Performing Technologist: Gertie Fey MHA, RDMS, RVT, RDCS  Examination Guidelines: A complete evaluation includes B-mode imaging, spectral Doppler, color Doppler, and power Doppler as needed of all accessible portions of each vessel. Bilateral testing is considered an integral part of a complete examination. Limited examinations for reoccurring indications may be performed as noted.  Duplex Findings: +--------------------+--------+--------+------+------------------+ Mesenteric          PSV cm/sEDV cm/sPlaque     Comments      +--------------------+--------+--------+------+------------------+ Aorta Prox            150      10                            +--------------------+--------+--------+------+------------------+ Celiac Artery Origin   49                                    +--------------------+--------+--------+------+------------------+ SMA Proximal                              Unable to insonate +--------------------+--------+--------+------+------------------+    +------------------+--------+--------+-------+ Right Renal ArteryPSV cm/sEDV cm/sComment +------------------+--------+--------+-------+ Origin              150      25           +------------------+--------+--------+-------+ Proximal  59                   +------------------+--------+--------+-------+ Mid                  63                    +------------------+--------+--------+-------+ Distal               69                   +------------------+--------+--------+-------+ +-----------------+--------+--------+-------+ Left Renal ArteryPSV cm/sEDV cm/sComment +-----------------+--------+--------+-------+ Origin             131      17           +-----------------+--------+--------+-------+ Proximal            20      4            +-----------------+--------+--------+-------+ Mid                 46                   +-----------------+--------+--------+-------+ Distal              31                   +-----------------+--------+--------+-------+ +------------+--------+--------+--+-----------+--------+--------+----+ Right KidneyPSV cm/sEDV cm/sRILeft KidneyPSV cm/sEDV cm/sRI   +------------+--------+--------+--+-----------+--------+--------+----+ Upper Pole  19                Upper Pole 16                   +------------+--------+--------+--+-----------+--------+--------+----+ Mid         18                Mid        17                   +------------+--------+--------+--+-----------+--------+--------+----+ Lower Pole  14                Lower Pole 20      4       0.80 +------------+--------+--------+--+-----------+--------+--------+----+ Hilar       53                Hilar      17                   +------------+--------+--------+--+-----------+--------+--------+----+ +------------------+---------+------------------+---------+ Right Kidney               Left Kidney                 +------------------+---------+------------------+---------+ RAR                        RAR                         +------------------+---------+------------------+---------+ RAR (manual)      1.0      RAR (manual)      0.87      +------------------+---------+------------------+---------+ Cortex            18/4 cm/sCortex            19/0 cm/s  +------------------+---------+------------------+---------+ Cortex thickness           Corex thickness             +------------------+---------+------------------+---------+ Kidney length (cm)9.13  Kidney length (cm)8.14      +------------------+---------+------------------+---------+  Summary: Renal:  Bilateral: No evidence of hemodynamically significant renal artery            stenosis. High resistant waveforms are noted throughout            bilateral kidneys. This along with echogenic bilateral            kidneys is suggestive of medical renal disease;            correlates with recent renal ultrasound.  Incidental finding: bilateral pleural effusions are visualized.  *See table(s) above for measurements and observations.  Diagnosing physician: Lemar Livings MD  Electronically signed by Lemar Livings MD on 12/23/2019 at 4:31:57 PM.    Final    US THORACENTESIS ASP PLEURAL SPACE W/IMG GUIDE  Result Date: 12/22/2019 INDICATION: Progressive renal failure with fluid overload and bilateral pleural effusions. Request for diagnostic and therapeutic thoracentesis. EXAM: ULTRASOUND GUIDED LEFT THORACENTESIS MEDICATIONS: 1% lidocaine 10 mL COMPLICATIONS: None immediate. PROCEDURE: An ultrasound guided thoracentesis was thoroughly discussed with the patient and questions answered. The benefits, risks, alternatives and complications were also discussed. The patient understands and wishes to proceed with the procedure. Written consent was obtained. Ultrasound was performed to localize and mark an adequate pocket of fluid in the left chest. The area was then prepped and draped in the normal sterile fashion. 1% Lidocaine was used for local anesthesia. Under ultrasound guidance a 6 Fr Safe-T-Centesis catheter was introduced. Thoracentesis was performed. The catheter was removed and a dressing applied. FINDINGS: A total of approximately 800 mL of clear yellow fluid was removed. Samples were sent to the  laboratory as requested by the clinical team. IMPRESSION: Successful ultrasound guided left thoracentesis yielding 800 mL of pleural fluid. Read by: Corrin Parker, PA-C Electronically Signed   By: Irish Lack M.D.   On: 12/22/2019 12:38    Cardiac Studies   See above  Patient Profile     Valerie Bonilla is a 86 y.o. female with a hx of HTN, DMII, HLD, CKD, gout who is being seen today for the evaluation of left ventricular hypertrophy at the request of Dr. Lewie Chamber.  Assessment & Plan    1. Heart Failure with preserved ejection fractions with LVH:  Patient presents with echo findings concerning for cardiac amyloidosis pending PYP scan. She remains mildly short of breath today, but otherwise, denies any additional symptoms. Patient is likely not a candidate for amyloidosis treatment as she is only minimally symptomatic from a CHF standpoint and currently has acute on chronic kidney disease. The patient was counseled regarding these recommendations.  - Will perform PYP study today.    2. HTN Emergency: - Patient is normotensive on the following home medicaitons - amlodipine 10 mg, Clonidine 0.2 mg BID, doxazosin 2 mg daily - Additionally she is on hydralazine 25 mg TID. Nicardipine gtt d/c'd on 11/13   3. IDA: - Patients symptoms have significantly improved since her transfusion. - Hgb trend as follows 8.3>6.2>8/4 - Iron studies shows ferritin of 66, iron of 24, and saturation ratio of 12% . - still no signs of bleeding, FOBT pending   4, Acute respiratory failure with hypoxia: - Patient is mildly short of breath today, but this has improved since thoracentesis (on 11/14 with 800 cc removed) - Fluid analysis shows exudative effusion.  - Management per primary team   5. Acute on chronic kidney failure: - Patient presented with acute decompensation of kidney function in  the setting of HTN emergency. - Nephrology currently managing.    For questions or updates, please contact  [facility] Please consult [email or web address] for contact info under Cardiology/STEMI.      Signed, Dellia Cloud, MD  12/24/2019, 7:12 AM    Patient examined doing better with transfusion PYP scan was negative for amyloid Given age and co morbidities would not pursue cardiac MRI Cr > 6 now Nephrology to discuss dialysis .    Charlton Haws MD [facility]

## 2019-12-24 NOTE — Progress Notes (Signed)
Per Dr Hyman Hopes, renal biopsy not to be done today. Ritu and Whitney in IR notified. Diet resumed.

## 2019-12-24 NOTE — Progress Notes (Signed)
Called to [facility] to see if pt can come down at this time for renal biopsy. RN states that she is unable to travel with pt at this time and she will contact [facility] to see if they are available. [facility] RN will have to stay during biopsy with pt as she has blood pressure drips that are being titrated at this time. RN states she will call me back. Notified us.

## 2019-12-24 NOTE — Evaluation (Signed)
Physical Therapy Evaluation Patient Details Name: Valerie Bonilla MRN: [id number] DOB: [birth year] Today's Date: 12/24/2019   History of Present Illness  Patient is a 85 y/o female who presents from MD office with HTN. Found to have HTN emergency and AKI on CKD. s/p thoracentesis 11/14. PMH includes HTN, HLD, DM, CKD, CHF.  Clinical Impression  Patient presents with generalized weakness, dyspnea on exertion, decreased activity tolerance and impaired mobility s/p above. Pt lives at home with family and is independent for ADLs and drives. Reports no falls. Today, pt requires Min guard-Min A for bed mobility, transfers and gait training. Limited by dyspnea on exertion requiring frequent rest breaks due to 2-3/4 DOE. Sp02 remained in 90s on 1-2L/min 02 [place]. Might benefit from using rollator vs RW to help with energy conservation and balance at home. Will trial next session. Will follow acutely to maximize independence and mobility prior to return home.    Follow Up Recommendations No PT follow up;Supervision for mobility/OOB    Equipment Recommendations  Rolling walker with 5" wheels (pending trial)    Recommendations for Other Services       Precautions / Restrictions Precautions Precautions: Fall Precaution Comments: watch BP Restrictions Weight Bearing Restrictions: No      Mobility  Bed Mobility Overal bed mobility: Needs Assistance Bed Mobility: Supine to Sit     Supine to sit: Min guard;HOB elevated     General bed mobility comments: Increased time and assist to manage lines.    Transfers Overall transfer level: Needs assistance Equipment used: None Transfers: Sit to/from Stand Sit to Stand: Min assist         General transfer comment: Min A to steady in standing. Stood from [employer], from toilet x1, transferred to chair post ambulation.  Ambulation/Gait Ambulation/Gait assistance: Min assist Gait Distance (Feet): 15 Feet (x2 bouts) Assistive device: 1 person hand  held assist Gait Pattern/deviations: Step-through pattern;Decreased stride length;Trunk flexed Gait velocity: decreased Gait velocity interpretation: <1.31 ft/sec, indicative of household ambulator General Gait Details: Slow, mildly unsteady gait with HHA progressing to no assist. CLose min guard-Min A for support. Cues to slow down as pt normally a fast walker. 2-3/4 DOE. Sp02 remained in 90s on 1-2L/min 02 [place].  Stairs            Wheelchair Mobility    Modified Rankin (Stroke Patients Only)       Balance Overall balance assessment: Needs assistance Sitting-balance support: Feet supported;No upper extremity supported Sitting balance-Leahy Scale: Good     Standing balance support: During functional activity Standing balance-Leahy Scale: Fair Standing balance comment: Able to stand statically wtihout UE support, needs close min guard-Min A for dynamic activities/walking. Able to perform pericare with UE support.                             Pertinent Vitals/Pain Pain Assessment: No/denies pain    Home Living Family/patient expects to be discharged to:: Private residence Living Arrangements: Children;Other relatives Available Help at Discharge: Family;Available PRN/intermittently Type of Home: House Home Access: Stairs to enter   [employer] of Steps: 1 Home Layout: Two level;Bed/bath upstairs Home Equipment: None      Prior Function Level of Independence: Independent         Comments: Drives, cares for self. No falls reported.     Hand Dominance   Dominant Hand: Right    Extremity/Trunk Assessment   Upper Extremity Assessment Upper Extremity Assessment:  Defer to OT evaluation    Lower Extremity Assessment Lower Extremity Assessment: Generalized weakness (but functional)    Cervical / Trunk Assessment Cervical / Trunk Assessment: Kyphotic  Communication   Communication: No difficulties  Cognition Arousal/Alertness:  Awake/alert Behavior During Therapy: WFL for tasks assessed/performed Overall Cognitive Status: Within Functional Limits for tasks assessed                                        General Comments General comments (skin integrity, edema, etc.): daughter present during session.    Exercises     Assessment/Plan    PT Assessment Patient needs continued PT services  PT Problem List Decreased strength;Decreased mobility;Decreased balance;Decreased activity tolerance;Cardiopulmonary status limiting activity       PT Treatment Interventions DME instruction;Therapeutic activities;Therapeutic exercise;Patient/family education;Gait training;Stair training;Balance training;Functional mobility training    PT Goals (Current goals can be found in the Care Plan section)  Acute Rehab PT Goals Patient Stated Goal: to be able to breathe and go home PT Goal Formulation: With patient Time For Goal Achievement: 01/07/20 Potential to Achieve Goals: Fair    Frequency Min 3X/week   Barriers to discharge Inaccessible home environment stairs to get to bedroom    Co-evaluation               AM-PAC PT "6 Clicks" Mobility  Outcome Measure Help needed turning from your back to your side while in a flat bed without using bedrails?: None Help needed moving from lying on your back to sitting on the side of a flat bed without using bedrails?: A Little Help needed moving to and from a bed to a chair (including a wheelchair)?: A Little Help needed standing up from a chair using your arms (e.g., wheelchair or bedside chair)?: A Little Help needed to walk in hospital room?: A Little Help needed climbing 3-5 steps with a railing? : A Little 6 Click Score: 19    End of Session Equipment Utilized During Treatment: Oxygen;Gait belt Activity Tolerance: Other (comment) (dyspnea) Patient left: in chair;with call bell/phone within reach;with family/visitor present Nurse Communication:  Mobility status PT Visit Diagnosis: Muscle weakness (generalized) (M62.81);Difficulty in walking, not elsewhere classified (R26.2);Other (comment) (dyspnea)    Time: 1884-1660 PT Time Calculation (min) (ACUTE ONLY): 31 min   Charges:   PT Evaluation $PT Eval Moderate Complexity: 1 Mod PT Treatments $Therapeutic Activity: 8-22 mins        Vale Haven, PT, DPT Acute Rehabilitation Services Pager [phone] Office [phone]      Blake Divine A Lanier [place] 12/24/2019, 2:57 PM

## 2019-12-24 NOTE — Progress Notes (Signed)
[facility] ROUNDING NOTE   Subjective:   Brief history This is an 85 year old lady with a history of hypertension diabetes mellitus type 2 hyperlipidemia chronic kidney disease gout.  Baseline serum creatinine appears to be about 1.2 to 1.3 mg/dL.  She has recurrent uncontrolled hypertension.  She is followed by Dr. Candiss Norse at [facility].  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  Secondary work-up including SPEP hepatitis B kappa lambda light chain ratio hepatitis C and HIV are unremarkable.  CPK elevated 467.  Biopsy placed on hold secondary to abrupt drop in hemoglobin.  Status post transfusion 1 unit packed red blood cells 12/23/2019.  I would wait stability of hemoglobin before proceeding with renal biopsy.  Blood pressure 170/90 pulse 82 temperature 98.6 O2 sats 99% 2 L urine output 350 cc 12/23/2019  Sodium 144 potassium 4.7 chloride 115 CO2 17 BUN 36 creatinine 6.07 glucose 113 calcium 8.3 phosphorus 5.5 magnesium 1.7 hemoglobin 8.4.  Objective:  Vital signs in last 24 hours:  Temp:  [98.6 F (37 C)-99.3 F (37.4 C)] 98.6 F (37 C) (11/16 0425) Pulse Rate:  [59-85] 70 (11/16 0600) Resp:  [11-24] 22 (11/16 0600) BP: (125-169)/(47-78) 155/70 (11/16 0600) SpO2:  [90 %-100 %] 98 % (11/16 0600)  Weight change:  Filed Weights   12/21/19 0839  Weight: 50.8 kg    Intake/Output: I/O last 3 completed shifts: In: 1932.9 [I.V.:1157.9; Blood:655; IV Piggyback:120] Out: 550 [Urine:550]   Intake/Output this shift:  No intake/output data recorded.  General:NAD, comfortable Heart:RRR, s1s2 nl, no rubs Lungs: Basal decreased breath sound, no increased work of breathing Abdomen:soft, Non-tender, non-distended Extremities:No edema Neurology: Alert awake and following commands.   Basic Metabolic Panel: Recent Labs  Lab 12/21/19 0930 12/21/19 0930 12/22/19 0225 12/23/19 0632  12/24/19 0358  NA 145  --  145 143 144  K 4.3  --  4.4 4.7 4.7  CL 113*  --  115* 115* 115*  CO2 21*  --  21* 20* 17*  GLUCOSE 93  --  115* 102* 113*  BUN 52*  --  50* 53* 56*  CREATININE 5.54*  --  5.48* 5.81* 6.07*  CALCIUM 8.5*   < > 8.1* 8.0* 8.3*  MG  --   --   --  1.6* 1.7  PHOS  --   --  4.7* 4.6 5.5*   < > = values in this interval not displayed.    Liver Function Tests: Recent Labs  Lab 12/22/19 0225 12/22/19 1428  AST  --  15  ALT  --  11  ALKPHOS  --  34*  BILITOT  --  0.3  PROT  --  4.4*  ALBUMIN 2.1* 2.0*   No results for input(s): LIPASE, AMYLASE in the last 168 hours. No results for input(s): AMMONIA in the last 168 hours.  CBC: Recent Labs  Lab 12/21/19 0930 12/23/19 0632 12/23/19 2123 12/24/19 0358  WBC 5.8 8.6 12.7* 10.0  NEUTROABS 4.2 6.6  --  8.0*  HGB 8.3* 6.2* 8.7* 8.4*  HCT 26.6* 19.5* 26.5* 26.0*  MCV 85.8 85.5 84.7 85.8  PLT 278 248 248 238    Cardiac Enzymes: No results for input(s): CKTOTAL, CKMB, CKMBINDEX, TROPONINI in the last 168 hours.  BNP: Invalid input(s): POCBNP  CBG: Recent Labs  Lab 12/22/19 2121 12/23/19 0756 12/23/19 1132 12/23/19 1705 12/23/19 2149  GLUCAP 147* 87 95 93 145*    Microbiology: Results for  orders placed or performed during the hospital encounter of 12/21/19  Respiratory Panel by RT PCR (Flu A&B, Covid) - Nasopharyngeal Swab     Status: None   Collection Time: 12/21/19  9:30 AM   Specimen: Nasopharyngeal Swab  Result Value Ref Range Status   SARS Coronavirus 2 by RT PCR NEGATIVE NEGATIVE Final    Comment: (NOTE) SARS-CoV-2 target nucleic acids are NOT DETECTED.  The SARS-CoV-2 RNA is generally detectable in upper respiratoy specimens during the acute phase of infection. The lowest concentration of SARS-CoV-2 viral copies this assay can detect is 131 copies/mL. A negative result does not preclude SARS-Cov-2 infection and should not be used as the sole basis for treatment or other patient  management decisions. A negative result may occur with  improper specimen collection/handling, submission of specimen other than nasopharyngeal swab, presence of viral mutation(s) within the areas targeted by this assay, and inadequate number of viral copies (<131 copies/mL). A negative result must be combined with clinical observations, patient history, and epidemiological information. The expected result is Negative.  Fact Sheet for Patients:  https://www.moore.com/  Fact Sheet for Healthcare Providers:  https://www.young.biz/  This test is no t yet approved or cleared by the [place] FDA and  has been authorized for detection and/or diagnosis of SARS-CoV-2 by FDA under an Emergency Use Authorization (EUA). This EUA will remain  in effect (meaning this test can be used) for the duration of the COVID-19 declaration under Section 564(b)(1) of the Act, 21 U.S.C. section 360bbb-3(b)(1), unless the authorization is terminated or revoked sooner.     Influenza A by PCR NEGATIVE NEGATIVE Final   Influenza B by PCR NEGATIVE NEGATIVE Final    Comment: (NOTE) The Xpert Xpress SARS-CoV-2/FLU/RSV assay is intended as an aid in  the diagnosis of influenza from Nasopharyngeal swab specimens and  should not be used as a sole basis for treatment. Nasal washings and  aspirates are unacceptable for Xpert Xpress SARS-CoV-2/FLU/RSV  testing.  Fact Sheet for Patients: https://www.moore.com/  Fact Sheet for Healthcare Providers: https://www.young.biz/  This test is not yet approved or cleared by the [place] FDA and  has been authorized for detection and/or diagnosis of SARS-CoV-2 by  FDA under an Emergency Use Authorization (EUA). This EUA will remain  in effect (meaning this test can be used) for the duration of the  Covid-19 declaration under Section 564(b)(1) of the Act, 21  U.S.C. section 360bbb-3(b)(1),  unless the authorization is  terminated or revoked. Performed at [facility], [address] [place], [place] [address]   MRSA PCR Screening     Status: None   Collection Time: 12/21/19  3:48 PM   Specimen: Nasal Mucosa; Nasopharyngeal  Result Value Ref Range Status   MRSA by PCR NEGATIVE NEGATIVE Final    Comment:        The GeneXpert MRSA Assay (FDA approved for NASAL specimens only), is one component of a comprehensive MRSA colonization surveillance program. It is not intended to diagnose MRSA infection nor to guide or monitor treatment for MRSA infections. Performed at [facility], [address], [place], [place] [address]   Urine Culture     Status: Abnormal (Preliminary result)   Collection Time: 12/21/19  8:37 PM   Specimen: Urine, Random  Result Value Ref Range Status   Specimen Description URINE, RANDOM  Final   Special Requests NONE  Final   Culture (A)  Final    30,000 COLONIES/mL ESCHERICHIA COLI SUSCEPTIBILITIES TO  FOLLOW Performed at [facility], [address] [place], [place] [address]    Report Status PENDING  Incomplete  Culture, body fluid-bottle     Status: None (Preliminary result)   Collection Time: 12/22/19 12:16 PM   Specimen: Peritoneal Washings  Result Value Ref Range Status   Specimen Description PERITONEAL FLUID  Final   Special Requests NONE  Final   Culture   Final    NO GROWTH < 24 HOURS Performed at [facility], [address] [place], [place] [address]    Report Status PENDING  Incomplete  Gram stain     Status: None   Collection Time: 12/22/19 12:16 PM   Specimen: Peritoneal Washings  Result Value Ref Range Status   Specimen Description PERITONEAL FLUID  Final   Special Requests NONE  Final   Gram Stain   Final    WBC PRESENT,BOTH PMN AND MONONUCLEAR NO ORGANISMS SEEN CYTOSPIN SMEAR Performed at [facility], [address] [place], [place] [address]    Report Status 12/22/2019 FINAL   Final    Coagulation Studies: No results for input(s): LABPROT, INR in the last 72 hours.  Urinalysis: Recent Labs    12/21/19 2040  COLORURINE YELLOW  LABSPEC 1.023  PHURINE 5.0  GLUCOSEU 50*  HGBUR SMALL*  BILIRUBINUR NEGATIVE  KETONESUR NEGATIVE  PROTEINUR >=300*  NITRITE NEGATIVE  LEUKOCYTESUR NEGATIVE      Imaging: DG Chest 1 View  Result Date: 12/22/2019 CLINICAL DATA:  Status post left thoracentesis. EXAM: CHEST  1 VIEW COMPARISON:  December 22, 2019 FINDINGS: Enlarged cardiac silhouette. Decreased left pleural effusion. Mild streaky airspace opacities in the left lung base. No radiographically apparent pneumothorax. Osseous structures are without acute abnormality. Soft tissues are grossly normal. IMPRESSION: 1. No radiographically apparent pneumothorax. 2. Decreased left pleural effusion. 3. Mild streaky airspace opacities in the left lung base. Electronically Signed   By: Ted Mcalpineobrinka  Dimitrova M.D.   On: 12/22/2019 13:29   DG CHEST PORT 1 VIEW  Result Date: 12/22/2019 CLINICAL DATA:  Pleural effusion EXAM: PORTABLE CHEST 1 VIEW COMPARISON:  None. FINDINGS: There is a fairly small left pleural effusion with left base atelectasis. There is a subtle area of ill-defined opacity in the right base. Lungs otherwise are clear. Heart is mildly enlarged with pulmonary vascularity normal. No adenopathy. No bone lesions. IMPRESSION: Small left pleural effusion with left base atelectasis. Focal area of opacity right base concerning for developing pneumonia. Mild cardiomegaly. No adenopathy. Electronically Signed   By: Bretta BangWilliam  Woodruff III M.D.   On: 12/22/2019 11:21   ECHOCARDIOGRAM COMPLETE  Result Date: 12/22/2019    ECHOCARDIOGRAM REPORT   Patient Name:   Wadie LessenFANNIE B Tipps Date of Exam: 12/22/2019 Medical Rec #:  [id number]       Height:       61.0 in Accession #:    [id number]      Weight:       112.0 lb Date of Birth:  September 16, 1934       BSA:          1.476 m Patient Age:    85 years         BP:           153/61 mmHg Patient Gender: F               HR:           63 bpm. Exam Location:  Inpatient Procedure: 2D Echo, Cardiac Doppler  and Color Doppler Indications:    Pleural effusion  History:        Patient has prior history of Echocardiogram examinations, most                 recent 03/16/2010. Risk Factors:Hypertension, Diabetes and                 Dyslipidemia. CKD.  Sonographer:    Ross Ludwig RDCS (AE) Referring Phys: [phone] DAVID GIRGUIS IMPRESSIONS  1. Left ventricular ejection fraction, by estimation, is 60 to 65%. The left ventricle has normal function. The left ventricle has no regional wall motion abnormalities. There is severe concentric left ventricular hypertrophy. Left ventricular diastolic  parameters are consistent with Grade II diastolic dysfunction (pseudonormalization). Elevated left ventricular end-diastolic pressure.  2. Right ventricular systolic function is normal. The right ventricular size is normal. There is mildly elevated pulmonary artery systolic pressure. The estimated right ventricular systolic pressure is 37.8 mmHg.  3. Left atrial size was severely dilated.  4. A small pericardial effusion is present. The pericardial effusion is circumferential.  5. The mitral valve is degenerative. Mild mitral valve regurgitation. No evidence of mitral stenosis.  6. The aortic valve is tricuspid. Aortic valve regurgitation is trivial. Mild aortic valve sclerosis is present, with no evidence of aortic valve stenosis.  7. The inferior vena cava is dilated in size with >50% respiratory variability, suggesting right atrial pressure of 8 mmHg.  8. The LV myocardium is severly thickened with a speckeled pattern. In the setting of pericardial effusion and severe LA enlargement, would consider possible amyloidosis. Consider PYP scan or cardiac MRI if clinically indicated. FINDINGS  Left Ventricle: Left ventricular ejection fraction, by estimation, is 60 to 65%. The left ventricle has normal  function. The left ventricle has no regional wall motion abnormalities. The left ventricular internal cavity size was normal in size. There is  severe concentric left ventricular hypertrophy. Left ventricular diastolic parameters are consistent with Grade II diastolic dysfunction (pseudonormalization). Elevated left ventricular end-diastolic pressure. Right Ventricle: The right ventricular size is normal. No increase in right ventricular wall thickness. Right ventricular systolic function is normal. There is mildly elevated pulmonary artery systolic pressure. The tricuspid regurgitant velocity is 2.73  m/s, and with an assumed right atrial pressure of 8 mmHg, the estimated right ventricular systolic pressure is 37.8 mmHg. Left Atrium: Left atrial size was severely dilated. Right Atrium: Right atrial size was normal in size. Pericardium: A small pericardial effusion is present. The pericardial effusion is circumferential. Mitral Valve: The mitral valve is degenerative in appearance. There is mild thickening of the mitral valve leaflet(s). Mild mitral annular calcification. Mild mitral valve regurgitation. No evidence of mitral valve stenosis. MV peak gradient, 6.4 mmHg. The mean mitral valve gradient is 2.0 mmHg. Tricuspid Valve: The tricuspid valve is normal in structure. Tricuspid valve regurgitation is trivial. No evidence of tricuspid stenosis. Aortic Valve: The aortic valve is tricuspid. Aortic valve regurgitation is trivial. Mild aortic valve sclerosis is present, with no evidence of aortic valve stenosis. Aortic valve mean gradient measures 5.0 mmHg. Aortic valve peak gradient measures 8.4 mmHg. Aortic valve area, by VTI measures 1.85 cm. Pulmonic Valve: The pulmonic valve was normal in structure. Pulmonic valve regurgitation is not visualized. No evidence of pulmonic stenosis. Aorta: The aortic root is normal in size and structure. Venous: The inferior vena cava is dilated in size with greater than 50%  respiratory variability, suggesting right atrial pressure of 8 mmHg. IAS/Shunts: No atrial level  shunt detected by color flow Doppler.  LEFT VENTRICLE PLAX 2D LVIDd:         4.20 cm  Diastology LVIDs:         2.20 cm  LV e' medial:    6.08 cm/s LV PW:         1.60 cm  LV E/e' medial:  17.4 LV IVS:        1.80 cm  LV e' lateral:   6.83 cm/s LVOT diam:     1.60 cm  LV E/e' lateral: 15.5 LV SV:         73 LV SV Index:   49 LVOT Area:     2.01 cm  RIGHT VENTRICLE            IVC RV Basal diam:  3.00 cm    IVC diam: 2.10 cm RV S prime:     9.30 cm/s TAPSE (M-mode): 2.3 cm LEFT ATRIUM              Index       RIGHT ATRIUM           Index LA diam:        3.60 cm  2.44 cm/m  RA Area:     15.10 cm LA Vol (A2C):   96.4 ml  65.30 ml/m RA Volume:   36.20 ml  24.52 ml/m LA Vol (A4C):   102.0 ml 69.09 ml/m LA Biplane Vol: 103.0 ml 69.77 ml/m  AORTIC VALVE AV Area (Vmax):    1.90 cm AV Area (Vmean):   1.63 cm AV Area (VTI):     1.85 cm AV Vmax:           145.00 cm/s AV Vmean:          107.000 cm/s AV VTI:            0.393 m AV Peak Grad:      8.4 mmHg AV Mean Grad:      5.0 mmHg LVOT Vmax:         137.00 cm/s LVOT Vmean:        86.600 cm/s LVOT VTI:          0.361 m LVOT/AV VTI ratio: 0.92  AORTA Ao Root diam: 2.80 cm Ao Asc diam:  2.00 cm MITRAL VALVE                TRICUSPID VALVE MV Area (PHT): 3.77 cm     TR Peak grad:   29.8 mmHg MV Peak grad:  6.4 mmHg     TR Vmax:        273.00 cm/s MV Mean grad:  2.0 mmHg MV Vmax:       1.26 m/s     SHUNTS MV Vmean:      60.6 cm/s    Systemic VTI:  0.36 m MV Decel Time: 201 msec     Systemic Diam: 1.60 cm MV E velocity: 106.00 cm/s MV A velocity: 53.60 cm/s MV E/A ratio:  1.98 Fransico Him MD Electronically signed by Fransico Him MD Signature Date/Time: 12/22/2019/1:37:09 PM    Final    VAS US RENAL ARTERY DUPLEX  Result Date: 12/23/2019 ABDOMINAL VISCERAL Indications: Hypertension, AKI High Risk Factors: Hypertension, hyperlipidemia, Diabetes. Limitations: Body habitus.  Comparison Study: No prior study Performing Technologist: Maudry Mayhew MHA, RDMS, RVT, RDCS  Examination Guidelines: A complete evaluation includes B-mode imaging, spectral Doppler, color Doppler, and power Doppler as needed of all accessible portions of each vessel. Bilateral  testing is considered an integral part of a complete examination. Limited examinations for reoccurring indications may be performed as noted.  Duplex Findings: +--------------------+--------+--------+------+------------------+ Mesenteric          PSV cm/sEDV cm/sPlaque     Comments      +--------------------+--------+--------+------+------------------+ Aorta Prox            150      10                            +--------------------+--------+--------+------+------------------+ Celiac Artery Origin   49                                    +--------------------+--------+--------+------+------------------+ SMA Proximal                              Unable to insonate +--------------------+--------+--------+------+------------------+    +------------------+--------+--------+-------+ Right Renal ArteryPSV cm/sEDV cm/sComment +------------------+--------+--------+-------+ Origin              150      25           +------------------+--------+--------+-------+ Proximal             59                   +------------------+--------+--------+-------+ Mid                  63                   +------------------+--------+--------+-------+ Distal               69                   +------------------+--------+--------+-------+ +-----------------+--------+--------+-------+ Left Renal ArteryPSV cm/sEDV cm/sComment +-----------------+--------+--------+-------+ Origin             131      17           +-----------------+--------+--------+-------+ Proximal            20      4            +-----------------+--------+--------+-------+ Mid                 46                    +-----------------+--------+--------+-------+ Distal              31                   +-----------------+--------+--------+-------+ +------------+--------+--------+--+-----------+--------+--------+----+ Right KidneyPSV cm/sEDV cm/sRILeft KidneyPSV cm/sEDV cm/sRI   +------------+--------+--------+--+-----------+--------+--------+----+ Upper Pole  19                Upper Pole 16                   +------------+--------+--------+--+-----------+--------+--------+----+ Mid         18                Mid        17                   +------------+--------+--------+--+-----------+--------+--------+----+ Lower Pole  14                Lower Pole 20      4  0.80 +------------+--------+--------+--+-----------+--------+--------+----+ Hilar       53                Hilar      17                   +------------+--------+--------+--+-----------+--------+--------+----+ +------------------+---------+------------------+---------+ Right Kidney               Left Kidney                 +------------------+---------+------------------+---------+ RAR                        RAR                         +------------------+---------+------------------+---------+ RAR (manual)      1.0      RAR (manual)      0.87      +------------------+---------+------------------+---------+ Cortex            18/4 cm/sCortex            19/0 cm/s +------------------+---------+------------------+---------+ Cortex thickness           Corex thickness             +------------------+---------+------------------+---------+ Kidney length (cm)9.13     Kidney length (cm)8.14      +------------------+---------+------------------+---------+  Summary: Renal:  Bilateral: No evidence of hemodynamically significant renal artery            stenosis. High resistant waveforms are noted throughout            bilateral kidneys. This along with echogenic bilateral            kidneys is suggestive  of medical renal disease;            correlates with recent renal ultrasound.  Incidental finding: bilateral pleural effusions are visualized.  *See table(s) above for measurements and observations.  Diagnosing physician: Servando Snare MD  Electronically signed by Servando Snare MD on 12/23/2019 at 4:31:57 PM.    Final    US THORACENTESIS ASP PLEURAL SPACE W/IMG GUIDE  Result Date: 12/22/2019 INDICATION: Progressive renal failure with fluid overload and bilateral pleural effusions. Request for diagnostic and therapeutic thoracentesis. EXAM: ULTRASOUND GUIDED LEFT THORACENTESIS MEDICATIONS: 1% lidocaine 10 mL COMPLICATIONS: None immediate. PROCEDURE: An ultrasound guided thoracentesis was thoroughly discussed with the patient and questions answered. The benefits, risks, alternatives and complications were also discussed. The patient understands and wishes to proceed with the procedure. Written consent was obtained. Ultrasound was performed to localize and mark an adequate pocket of fluid in the left chest. The area was then prepped and draped in the normal sterile fashion. 1% Lidocaine was used for local anesthesia. Under ultrasound guidance a 6 Fr Safe-T-Centesis catheter was introduced. Thoracentesis was performed. The catheter was removed and a dressing applied. FINDINGS: A total of approximately 800 mL of clear yellow fluid was removed. Samples were sent to the laboratory as requested by the clinical team. IMPRESSION: Successful ultrasound guided left thoracentesis yielding 800 mL of pleural fluid. Read by: Gareth Eagle, PA-C Electronically Signed   By: Aletta Edouard M.D.   On: 12/22/2019 12:38     Medications:   . sodium chloride 10 mL/hr at 12/24/19 0600  . niCARDipine 5 mg/hr (12/24/19 0600)   . amLODipine  10 mg Oral Daily  . Chlorhexidine Gluconate Cloth  6 each Topical Daily  . cloNIDine  0.2 mg Oral BID  .  doxazosin  2 mg Oral Daily  . hydrALAZINE  25 mg Oral TID  . insulin aspart  0-5  Units Subcutaneous QHS  . insulin aspart  0-6 Units Subcutaneous TID WC  . sodium chloride flush  3 mL Intravenous Q12H   acetaminophen **OR** acetaminophen, ipratropium-albuterol  Assessment/ Plan:  1. 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 Kidney ultrasound with echogenic kidneys however incidental finding of pleural effusion. Planning for kidney biopsy.  12/23/2019.  Noted drop in hemoglobin.  Will await stability in hemoglobin before proceeding with renal biopsy.  Not sure there is much that a renal biopsy will add to therapy at this point. Monitor BMP, strict ins and out, avoid nephrotoxins.  2. Nephrotic range proteinuria, concerning for FSGS, collapsing variant. Around 10 g of proteinuria. Secondary work-up including SPEP, kappa lambda ratio, hep B, hep C, HIV unremarkable. CPK only elevated to 467. Repeat UA with no RBC, 9.2 g of protein. Pending ANA, ANCA, C3, C4, anti-GBM. IR to perform renal biopsy 12/23/2019 however concerns regarding abrupt drop in hemoglobin.  We will hold off on renal biopsy until there is stability.  Patient is heading towards needing dialysis support.  3.Hypertensive urgency: Amlodipine dose increased to 10 mg.  Will increase hydralazine and doxazosin.  4.Anemia: Iron saturation 12% status post transfusion we will add ESA.  5.Bilateral moderate to large pleural effusion seen on kidney ultrasound: She is requiring oxygen this morning.  Some shortness of breath.  I have discussed this finding with primary team.  Getting x-ray and possible thoracocentesis by IR.   LOS: 3 Garnetta Buddy @TODAY @7 :32 AM

## 2019-12-24 NOTE — Progress Notes (Signed)
PROGRESS NOTE  Valerie Bonilla  DOB: 01-22-1935  PCP: Maurice Small, MD BWG:[id number]  DOA: 12/21/2019  LOS: 3 days   Chief Complaint  Patient presents with  . Hypertension    Brief narrative: Valerie Bonilla is a 85 y.o. female who was sent to the ED on 12/21/2019 for worsening creatinine level. PMH significant for resistant HTN, DMII, HLD, CKD (unknown stage), goutShe follows with Dr. Thedore Mins and baseline creat is 1.2-1.3.  The patient was seen by Dr. Thedore Mins at [place] [facility] for AKI with creatinine level of 5.  Further evaluation showed nephrotic range proteinuria around 10 g without any microscopic hematuria.  SPEP, kappa lambda ratio, hepatitis B, C, HIV was unremarkable.  She was however found to have systolic blood pressure > 200 therefore directed to the hospital for better blood pressure control with the plan of kidney biopsy.  On assessment in the ER her BP was 210s-250s/70s-100s). She states compliance at home with her BP regimen without any missed doses recently (at home on amlodipine, clonidine, doxazosin, toprol).  She was started on nicardipine drip given excessively elevated pressures.   The labs showed BUN 52, creatinine 5.54, CO2 21, hemoglobin 8.3.  Covid negative.  Nephrology and IR planned for renal biopsy after admission and BP stabilization.   Subjective: Patient was seen and examined this morning.  Nephrology was at bedside.  Pleasant elderly African-American female.  Lying down in bed.  Daughter at bedside.  Patient alert, awake, she feels weak.  Assessment/Plan: Hypertensive emergency - patient has had difficulty at least recently with BP control (elevated SBP 230 in office recently); patient states compliance with meds - renal function has rapidly deteriorated recently (concern for underlying glomerular disease per neprho, which may be contributed from by severely elevated BP) - continue cardene; weaning as able; okay to pursue BP  <140/90 -Continue other blood pressure medicines including amlodipine, clonidine, doxazosin, hydralazine.  Acute on chronic kidney failure - CKD stage unknown; no prior values or notes available for review - baseline creat 1.2 - 1.3 per nephrology.  Creatinine continues to uptrend.  -concern is for FSGS or other glomerular disease  -Renal ultrasound showed a small, echogenic kidneys; mild dilated collecting system -Renal biopsy was discussed but likely low yield.  Nephrologist Dr. Hyman Hopes recommended against it this morning.  Patient agreed not to proceed with biopsy.  -Patient may be heading towards dialysis but she is not a good candidate for it.  Nephrology to discuss with family. Recent Labs    12/21/19 0930 12/22/19 0225 12/23/19 0632 12/24/19 0358  BUN 52* 50* 53* 56*  CREATININE 5.54* 5.48* 5.81* 6.07*   IDA (iron deficiency anemia) - ferritin 66, iron 24, sat ratio 12% - s/p nulecit started 250mg  daily on 11/14 - no obvious bleeding or large risk factors but Hgb has downtrended 8.3>>6.2 g/dL on 12/14.  1 unit of PRBC was given.  Hemoglobin is improved this morning.  Recent Labs    12/21/19 0930 12/21/19 0930 12/21/19 1652 12/23/19 0632 12/23/19 0632 12/23/19 2123 12/24/19 0358  HGB 8.3*  --   --  6.2*  --  8.7* 8.4*  MCV 85.8   < >  --  85.5   < > 84.7 85.8  FERRITIN  --   --  66  --   --   --   --   TIBC  --   --  209*  --   --   --   --   IRON  --   --  24*  --   --   --   --    < > = values in this interval not displayed.   Acute diastolic CHF (congestive heart failure) (HCC) - echo was obtained due to uncontrolled BP - EF 60-65%, Gr II DD, severe LVH, LA dilated; LV myocardium noted to be "severely thickened with a speckled pattern" - due to underlying rapid renal failure with increased protein, unsure if the two could be related to an underlying amyloid process? - cardiology consulted to see if needs further imaging (PYP scan vs MRI) - check BNP; no obvious  overload/edema in extremities but CXR on 11/14 had left pleural effusion s/p 800 cc from thoracentesis and echo also mentions small pericardial effusion  Acute respiratory failure with hypoxia (HCC) -Likely due to left pleural effusion.  800 cc off pleural fluid tapped on 11/14. - wean O2 as able   Type 2 diabetes mellitus with other specified complication (HCC) - check A1c = 5% - continue SSI and CBGs  Hyperlipidemia -On statin at home.  Mobility: Order PT eval. Code Status:   Code Status: Full Code  Nutritional status: Body mass index is 21.16 kg/m.     Diet Order            Diet Heart Room service appropriate? Yes with Assist; Fluid consistency: Thin  Diet effective now                 DVT prophylaxis: SCDs Start: 12/21/19 1515   Antimicrobials:  None Fluid: None Consultants: Nephrology Family Communication:  Daughter at bedside  Status is: Inpatient  Remains inpatient appropriate because -continues to have worsening renal failure, continues on nicardipine drip  Dispo: The patient is from: Home              Anticipated d/c is to: Home, likely              Anticipated d/c date is: 2 to 3 days              Patient currently is not medically stable to d/c.       Infusions:  . sodium chloride 10 mL/hr at 12/24/19 0600  . niCARDipine 5 mg/hr (12/24/19 0600)    Scheduled Meds: . amLODipine  10 mg Oral Daily  . Chlorhexidine Gluconate Cloth  6 each Topical Daily  . cloNIDine  0.2 mg Oral BID  . darbepoetin (ARANESP) injection - NON-DIALYSIS  200 mcg Subcutaneous Q Tue-1800  . doxazosin  8 mg Oral Daily  . hydrALAZINE  100 mg Oral TID  . insulin aspart  0-5 Units Subcutaneous QHS  . insulin aspart  0-6 Units Subcutaneous TID WC  . sodium chloride flush  3 mL Intravenous Q12H    Antimicrobials: Anti-infectives (From admission, onward)   None      PRN meds: acetaminophen **OR** acetaminophen, ipratropium-albuterol   Objective: Vitals:    12/24/19 1100 12/24/19 1200  BP: (!) 161/56 (!) 164/62  Pulse: 80 85  Resp: 14 (!) 30  Temp:    SpO2: 99% 99%    Intake/Output Summary (Last 24 hours) at 12/24/2019 1229 Last data filed at 12/24/2019 0600 Gross per 24 hour  Intake 1194.99 ml  Output 550 ml  Net 644.99 ml   Filed Weights   12/21/19 0839  Weight: 50.8 kg   Weight change:  Body mass index is 21.16 kg/m.   Physical Exam: General exam: Appears calm and comfortable.  Not in physical distress.  Looks pale Skin:  No rashes, lesions or ulcers. HEENT: Atraumatic, normocephalic, supple neck, no obvious bleeding Lungs: Clear to auscultation bilaterally CVS: Regular rate and rhythm, no murmur GI/Abd soft, nontender, nondistended, bowel sound present CNS: Alert, awake, oriented x3 Psychiatry: Mood appropriate Extremities: No pedal edema, no calf tenderness  Data Review: I have personally reviewed the laboratory data and studies available.  Recent Labs  Lab 12/21/19 0930 12/23/19 0632 12/23/19 2123 12/24/19 0358  WBC 5.8 8.6 12.7* 10.0  NEUTROABS 4.2 6.6  --  8.0*  HGB 8.3* 6.2* 8.7* 8.4*  HCT 26.6* 19.5* 26.5* 26.0*  MCV 85.8 85.5 84.7 85.8  PLT 278 248 248 238   Recent Labs  Lab 12/21/19 0930 12/22/19 0225 12/23/19 0632 12/24/19 0358  NA 145 145 143 144  K 4.3 4.4 4.7 4.7  CL 113* 115* 115* 115*  CO2 21* 21* 20* 17*  GLUCOSE 93 115* 102* 113*  BUN 52* 50* 53* 56*  CREATININE 5.54* 5.48* 5.81* 6.07*  CALCIUM 8.5* 8.1* 8.0* 8.3*  MG  --   --  1.6* 1.7  PHOS  --  4.7* 4.6 5.5*    F/u labs ordered.  Signed, Lorin Glass, MD [facility] 12/24/2019

## 2019-12-24 NOTE — Progress Notes (Signed)
Discussion with [name]  ( son )  Updated him on the investigations so far and the treatment plan.  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  This would make sense as a unifying diagnosis.  She is nephrotic and now with worsening renal failure that could necessitate dialysis as an possible option. I asked him to consider that her age and other health issues may make dialysis challenging for his mother. I also asked him to consider hospice and palliative care. Further I noted that Ms Albert is a Full Code and asked him about his feelings about this.   He will meet with me and Dr Pola Corn at the bedside tomorrow to discuss the next steps and asked him to consider all of the above options overnight. He would like his mother to remain a Full Code for the time being

## 2019-12-24 NOTE — Progress Notes (Addendum)
pT WITH EXPIRATORY WHEEZE,  Mild shortness of breath, sats and rr WNL. Prn neb treatment given. Will cont to monitor.  18:10 pt lungs clear, states she feels a little better. Above discussed with primary MD D[place].

## 2019-12-25 DIAGNOSIS — I5031 Acute diastolic (congestive) heart failure: Secondary | ICD-10-CM | POA: Diagnosis not present

## 2019-12-25 DIAGNOSIS — I161 Hypertensive emergency: Secondary | ICD-10-CM | POA: Diagnosis not present

## 2019-12-25 LAB — PHOSPHORUS: Phosphorus: 5.4 mg/dL — ABNORMAL HIGH (ref 2.5–4.6)

## 2019-12-25 LAB — GLUCOSE, CAPILLARY
Glucose-Capillary: 100 mg/dL — ABNORMAL HIGH (ref 70–99)
Glucose-Capillary: 152 mg/dL — ABNORMAL HIGH (ref 70–99)

## 2019-12-25 LAB — MAGNESIUM: Magnesium: 1.6 mg/dL — ABNORMAL LOW (ref 1.7–2.4)

## 2019-12-25 LAB — CBC WITH DIFFERENTIAL/PLATELET
Abs Immature Granulocytes: 0.06 10*3/uL (ref 0.00–0.07)
Basophils Absolute: 0 10*3/uL (ref 0.0–0.1)
Basophils Relative: 0 %
Eosinophils Absolute: 0.1 10*3/uL (ref 0.0–0.5)
Eosinophils Relative: 1 %
HCT: 25 % — ABNORMAL LOW (ref 36.0–46.0)
Hemoglobin: 8.2 g/dL — ABNORMAL LOW (ref 12.0–15.0)
Immature Granulocytes: 1 %
Lymphocytes Relative: 10 %
Lymphs Abs: 0.9 10*3/uL (ref 0.7–4.0)
MCH: 28.2 pg (ref 26.0–34.0)
MCHC: 32.8 g/dL (ref 30.0–36.0)
MCV: 85.9 fL (ref 80.0–100.0)
Monocytes Absolute: 0.6 10*3/uL (ref 0.1–1.0)
Monocytes Relative: 7 %
Neutro Abs: 7.5 10*3/uL (ref 1.7–7.7)
Neutrophils Relative %: 81 %
Platelets: 232 10*3/uL (ref 150–400)
RBC: 2.91 MIL/uL — ABNORMAL LOW (ref 3.87–5.11)
RDW: 15.3 % (ref 11.5–15.5)
WBC: 9.1 10*3/uL (ref 4.0–10.5)
nRBC: 0 % (ref 0.0–0.2)

## 2019-12-25 LAB — BASIC METABOLIC PANEL
Anion gap: 11 (ref 5–15)
BUN: 60 mg/dL — ABNORMAL HIGH (ref 8–23)
CO2: 17 mmol/L — ABNORMAL LOW (ref 22–32)
Calcium: 8.4 mg/dL — ABNORMAL LOW (ref 8.9–10.3)
Chloride: 116 mmol/L — ABNORMAL HIGH (ref 98–111)
Creatinine, Ser: 6.14 mg/dL — ABNORMAL HIGH (ref 0.44–1.00)
GFR, Estimated: 6 mL/min — ABNORMAL LOW (ref >60)
Glucose, Bld: 117 mg/dL — ABNORMAL HIGH (ref 70–99)
Potassium: 4.9 mmol/L (ref 3.5–5.1)
Sodium: 144 mmol/L (ref 135–145)

## 2019-12-25 MED ORDER — INFLUENZA VAC A&B SA ADJ QUAD 0.5 ML IM PRSY
0.5000 mL | PREFILLED_SYRINGE | Freq: Once | INTRAMUSCULAR | Status: AC
  Administered 2019-12-25: 0.5 mL via INTRAMUSCULAR
  Filled 2019-12-25: qty 0.5

## 2019-12-25 NOTE — Progress Notes (Signed)
[facility] consulted to obtain IV access for Cardene to restart. Upon arriving at bedside, hospitalist requested holding off on IV placement for now as he plans to switch her meds to PO route. Notified Consuella Lose, Charity fundraiser.  If IV is needed at later time, Consuella Lose will place IVT consult.

## 2019-12-25 NOTE — Progress Notes (Signed)
PROGRESS NOTE  Valerie Bonilla  DOB: 1934-08-14  PCP: Maurice Small, MD ZOX:[id number]  DOA: 12/21/2019  LOS: 4 days   Chief Complaint  Patient presents with  . Hypertension    Brief narrative: Valerie Bonilla is a 85 y.o. female who was sent to the ED on 12/21/2019 for worsening creatinine level. PMH significant for resistant HTN, DMII, HLD, CKD (unknown stage), goutShe follows with Dr. Thedore Mins and baseline creat is 1.2-1.3.  The patient was seen by Dr. Thedore Mins at [place] [facility] for AKI with creatinine level of 5.  Further evaluation showed nephrotic range proteinuria around 10 g without any microscopic hematuria.  SPEP, kappa lambda ratio, hepatitis B, C, HIV was unremarkable.  She was however found to have systolic blood pressure > 200 therefore directed to the hospital for better blood pressure control with the plan of kidney biopsy.  On assessment in the ER her BP was 210s-250s/70s-100s). She states compliance at home with her BP regimen without any missed doses recently (at home on amlodipine, clonidine, doxazosin, toprol).  She was started on nicardipine drip given excessively elevated pressures.   The labs showed BUN 52, creatinine 5.54, CO2 21, hemoglobin 8.3.  Covid negative.  Nephrology and IR planned for renal biopsy after admission and BP stabilization.  After discussion with family by nephrology and primary team, family decided not to go ahead with renal biopsy/dialysis and made a decision to take her home with hospice care.  Subjective: Patient was seen and examined this morning.   Multiple family members at bedside.  Very understanding family.   Patient was not in distress.  She is also happy with her overall plan of hospice care.  Assessment/Plan: Hypertensive emergency - patient has had difficulty at least recently with BP control (elevated SBP 230 in office recently); patient states compliance with meds - renal function has rapidly deteriorated recently  (concern for underlying glomerular disease per neprho, which may be contributed from by severely elevated BP) -Cardizem drip stopped.  Okay to continue other blood pressure medicines including amlodipine, clonidine, doxazosin, hydralazine.  Acute on chronic kidney failure - CKD stage unknown; no prior values or notes available for review - baseline creat 1.2 - 1.3 per nephrology.  Creatinine continues to uptrend.  -concern is for FSGS or other glomerular disease  -Renal ultrasound showed a small, echogenic kidneys; mild dilated collecting system -Renal biopsy was discussed but likely low yield.  Family decided not to proceed with it. Recent Labs    12/21/19 0930 12/22/19 0225 12/23/19 0632 12/24/19 0358 12/25/19 0054  BUN 52* 50* 53* 56* 60*  CREATININE 5.54* 5.48* 5.81* 6.07* 6.14*   IDA (iron deficiency anemia) - ferritin 66, iron 24, sat ratio 12% - s/p nulecit started 250mg  daily on 11/14 - no obvious bleeding or large risk factors but Hgb has downtrended 8.3>>6.2 g/dL on 12/14.  1 unit of PRBC was given.  Hemoglobin remainS stable this morning at 8.2. Recent Labs    12/21/19 0930 12/21/19 0930 12/21/19 1652 12/23/19 0632 12/23/19 0632 12/23/19 2123 12/23/19 2123 12/24/19 0358 12/25/19 0054  HGB 8.3*  --   --  6.2*  --  8.7*  --  8.4* 8.2*  MCV 85.8   < >  --  85.5   < > 84.7   < > 85.8 85.9  FERRITIN  --   --  66  --   --   --   --   --   --   TIBC  --   --  209*  --   --   --   --   --   --   IRON  --   --  24*  --   --   --   --   --   --    < > = values in this interval not displayed.   Acute diastolic CHF (congestive heart failure) (HCC) - echo was obtained due to uncontrolled BP - EF 60-65%, Gr II DD, severe LVH, LA dilated;   Acute respiratory failure with hypoxia (HCC) -Likely due to left pleural effusion.  800 cc off pleural fluid tapped on 11/14. - wean O2 as able   Type 2 diabetes mellitus with other specified complication (HCC) -check A1c =  5% -continue SSI and CBGs  Hyperlipidemia -On statin at home.  Mobility: Order PT eval. Code Status:   Code Status: DNR  Nutritional status: Body mass index is 21.16 kg/m.     Diet Order            Diet Heart Room service appropriate? Yes with Assist; Fluid consistency: Thin  Diet effective now                 DVT prophylaxis: SCDs Start: 12/21/19 1515   Antimicrobials:  None Fluid: None Consultants: Nephrology Family Communication:  Daughter at bedside  Status is: Inpatient  Remains inpatient appropriate because -pending arrangements for home hospice discharge likely tomorrow   Infusions:  . sodium chloride Stopped (12/25/19 0257)  . niCARDipine Stopped (12/25/19 0019)    Scheduled Meds: . amLODipine  10 mg Oral Daily  . Chlorhexidine Gluconate Cloth  6 each Topical Daily  . cloNIDine  0.2 mg Oral BID  . darbepoetin (ARANESP) injection - NON-DIALYSIS  200 mcg Subcutaneous Q Tue-1800  . doxazosin  8 mg Oral Daily  . hydrALAZINE  100 mg Oral TID  . influenza vaccine adjuvanted  0.5 mL Intramuscular Once  . insulin aspart  0-5 Units Subcutaneous QHS  . insulin aspart  0-6 Units Subcutaneous TID WC  . pantoprazole  40 mg Oral Daily  . sodium chloride flush  3 mL Intravenous Q12H    Antimicrobials: Anti-infectives (From admission, onward)   None      PRN meds: acetaminophen **OR** acetaminophen, ipratropium-albuterol   Objective: Vitals:   12/25/19 1134 12/25/19 1500  BP: (!) 143/46 (!) 171/49  Pulse: 73 79  Resp: 16 16  Temp: 98.6 F (37 C) 98.5 F (36.9 C)  SpO2: 99% 98%    Intake/Output Summary (Last 24 hours) at 12/25/2019 1701 Last data filed at 12/25/2019 0600 Gross per 24 hour  Intake 570.22 ml  Output 75 ml  Net 495.22 ml   Filed Weights   12/21/19 0839  Weight: 50.8 kg   Weight change:  Body mass index is 21.16 kg/m.   Physical Exam: General exam: Appears calm and comfortable.  Not in physical distress.  Looks pale Skin:  No rashes, lesions or ulcers. HEENT: Atraumatic, normocephalic, supple neck, no obvious bleeding Lungs: Clear to auscultation bilaterally CVS: Regular rate and rhythm, no murmur GI/Abd soft, nontender, nondistended, bowel sound present CNS: Alert, awake.  Oriented to place and person.  Not restless or agitated. Psychiatry: Mood appropriate Extremities: No pedal edema, no calf tenderness  Data Review: I have personally reviewed the laboratory data and studies available.  Recent Labs  Lab 12/21/19 0930 12/23/19 0632 12/23/19 2123 12/24/19 0358 12/25/19 0054  WBC 5.8 8.6 12.7* 10.0 9.1  NEUTROABS 4.2 6.6  --  8.0* 7.5  HGB 8.3* 6.2* 8.7* 8.4* 8.2*  HCT 26.6* 19.5* 26.5* 26.0* 25.0*  MCV 85.8 85.5 84.7 85.8 85.9  PLT 278 248 248 238 232   Recent Labs  Lab 12/21/19 0930 12/22/19 0225 12/23/19 0632 12/24/19 0358 12/25/19 0054  NA 145 145 143 144 144  K 4.3 4.4 4.7 4.7 4.9  CL 113* 115* 115* 115* 116*  CO2 21* 21* 20* 17* 17*  GLUCOSE 93 115* 102* 113* 117*  BUN 52* 50* 53* 56* 60*  CREATININE 5.54* 5.48* 5.81* 6.07* 6.14*  CALCIUM 8.5* 8.1* 8.0* 8.3* 8.4*  MG  --   --  1.6* 1.7 1.6*  PHOS  --  4.7* 4.6 5.5* 5.4*    F/u labs ordered.  Signed, Lorin Glass, MD [facility] 12/25/2019

## 2019-12-25 NOTE — Progress Notes (Signed)
Have discussed with family and they would like hospice and palliative medicine involved   Not interested in dialysis or additional work up. I concur completely with their decision

## 2019-12-25 NOTE — Progress Notes (Signed)
Physical Therapy Treatment Patient Details Name: Valerie Bonilla MRN: [id number] DOB: 21-May-1934 Today's Date: 12/25/2019    History of Present Illness Patient is a 85 y/o female who presents from MD office with HTN. Found to have HTN emergency and AKI on CKD. s/p thoracentesis 11/14. PMH includes HTN, HLD, DM, CKD, CHF.    PT Comments    Pt agreeable with a little encouragement.  Emphasis on transfer safety with her new RW.  Sats maintained throughout on 3-4 L [place].    Follow Up Recommendations  No PT follow up;Supervision for mobility/OOB     Equipment Recommendations  Rolling walker with 5" wheels    Recommendations for Other Services       Precautions / Restrictions Precautions Precautions: Fall    Mobility  Bed Mobility Overal bed mobility: Needs Assistance Bed Mobility: Supine to Sit;Sit to Supine     Supine to sit: Min guard;HOB elevated Sit to supine: Min guard   General bed mobility comments: Increased time and assist to manage lines.  Transfers Overall transfer level: Needs assistance Equipment used: Rolling walker (2 wheeled) Transfers: Sit to/from Stand Sit to Stand: Min assist         General transfer comment: demo/cues for safety--hand placement with RW.  boost assist.  Ambulation/Gait Ambulation/Gait assistance: Min assist Gait Distance (Feet): 30 Feet Assistive device: Rolling walker (2 wheeled) Gait Pattern/deviations: Step-through pattern;Decreased stride length;Trunk flexed Gait velocity: decreased Gait velocity interpretation: <1.31 ft/sec, indicative of household ambulator General Gait Details: slow, mildly unsteady,  cues for posture and proximity to the RW.  SpO2 stayed at 98% on 4L, minimal SOB   Stairs             Wheelchair Mobility    Modified Rankin (Stroke Patients Only)       Balance Overall balance assessment: Needs assistance   Sitting balance-Leahy Scale: Good     Standing balance support: During  functional activity Standing balance-Leahy Scale: Fair Standing balance comment: Able to stand statically wtihout UE support, needs close min guard-Min A for dynamic activities/walking. Able to perform pericare with UE support.                            Cognition Arousal/Alertness: Awake/alert Behavior During Therapy: WFL for tasks assessed/performed Overall Cognitive Status: Within Functional Limits for tasks assessed                                        Exercises      General Comments General comments (skin integrity, edema, etc.): Talked to pt about use of RW for energy conservation.  She noted less dyspnea and fatigue with RW      Pertinent Vitals/Pain Pain Assessment: Faces Faces Pain Scale: No hurt Pain Intervention(s): Monitored during session    Home Living                      Prior Function            PT Goals (current goals can now be found in the care plan section) Acute Rehab PT Goals Patient Stated Goal: to be able to breathe and go home PT Goal Formulation: With patient Time For Goal Achievement: 01/07/20 Potential to Achieve Goals: Fair Progress towards PT goals: Progressing toward goals    Frequency    Min 3X/week  PT Plan Current plan remains appropriate    Co-evaluation              AM-PAC PT "6 Clicks" Mobility   Outcome Measure  Help needed turning from your back to your side while in a flat bed without using bedrails?: None Help needed moving from lying on your back to sitting on the side of a flat bed without using bedrails?: A Little Help needed moving to and from a bed to a chair (including a wheelchair)?: A Little Help needed standing up from a chair using your arms (e.g., wheelchair or bedside chair)?: A Little Help needed to walk in hospital room?: A Little Help needed climbing 3-5 steps with a railing? : A Little 6 Click Score: 19    End of Session Equipment Utilized During  Treatment: Oxygen Activity Tolerance: Patient tolerated treatment well Patient left: in bed;with call bell/phone within reach;with bed alarm set;with family/visitor present Nurse Communication: Mobility status PT Visit Diagnosis: Muscle weakness (generalized) (M62.81);Difficulty in walking, not elsewhere classified (R26.2);Other (comment)     Time: 2637-8588 PT Time Calculation (min) (ACUTE ONLY): 23 min  Charges:  $Gait Training: 8-22 mins $Therapeutic Activity: 8-22 mins                     12/25/2019  Jacinto Halim., PT Acute Rehabilitation Services [phone]  (pager) [phone]  (office)   Valerie Bonilla 12/25/2019, 7:31 PM

## 2019-12-25 NOTE — Progress Notes (Addendum)
Ordered by Dr Hyman Hopes to allow all three children to come in this morning to facilitate decision making. Son Valerie Bonilla called. Anticipate son Valerie Bonilla, son Valerie Bonilla and daughter Valerie Bonilla to visit today. Son Valerie Bonilla states that he anticipates taking the pt home with Hospice.

## 2019-12-25 NOTE — Progress Notes (Signed)
Civil engineer, contracting [facility])  Received request from [facility] for hospice services at home after discharge.  Chart and pt information under review by [facility] physician.  Hospice eligibility pending at this time.  Hospital liaison spoke with [name], patient's son, to initiate education related to hospice philosophy and services and to answer any questions at this time.  [name] verbalized understanding of information given.  Per discussion the plan is to discharge home today by private vehicle.    Pease send signed and completed DNR home with pt/family.  Please provide prescriptions at discharge as needed to ensure ongoing symptom management until patient can be admitted onto hospice services.    DME needs discussed.  A rolling walker and portable O2 tanks have been ordered for delivery to [employer].  A hospital bed, over bed table, O2 concentrator, and a shower chair have been ordered for delivery to the home. Address has been verified and is correct in the chart.  ACC information and contact numbers given to [facility].  Above information shared with Elvis Coil Manager.  Please call with any questions or concerns.  Thank you for the opportunity to participate in this pt's care.  Gillian Scarce, BSN, RN [employer] [phone] (24h on call)

## 2019-12-25 NOTE — TOC Initial Note (Signed)
Transition of Care [facility]) - Initial/Assessment Note    Patient Details  Name: Valerie Bonilla MRN: [id number] Date of Birth: Dec 22, 1934  Transition of Care [facility]) CM/SW Contact:    Glennon Mac, RN Phone Number: 12/25/2019, 12:35 PM  Clinical Narrative: Patient is a 85 y/o female who presents from MD office with HTN. Found to have HTN emergency and AKI on CKD. s/p thoracentesis 11/14.   PTA, pt independent and living at home with her son, Valerie Bonilla. Pt/family have chosen not to do dialysis or additional workup for her kidney failure, and they would like to go home with Hospice care. Spoke with son, Valerie Bonilla, by phone; he prefers Civil engineer, contracting for [employer]; referral made accordingly to [place] with [employer]. DME needs include: RW, oxygen, hospital bed, and tub bench/seat.     Addendum: 1245pm: referral received by Gillian Scarce with [facility]; she will call son and confirm information.  Per hospice rep, she should be able to coordinate equipment and hospice services with plan to discharge later today.  Will follow with updates as available.                   Expected Discharge Plan: Home w Hospice Care Barriers to Discharge: Continued Medical Work up   Patient Goals and CMS Choice   CMS Medicare.gov Compare Post Acute Care list provided to:: Patient Represenative (must comment) (son Valerie Bonilla) Choice offered to / list presented to : Adult Children  Expected Discharge Plan and Services Expected Discharge Plan: Home w Hospice Care   Discharge Planning Services: CM Consult Post Acute Care Choice: Hospice Living arrangements for the past 2 months: Single Family Home                                      Prior Living Arrangements/Services Living arrangements for the past 2 months: Single Family Home Lives with:: Adult Children Patient language and need for interpreter reviewed:: Yes Do you feel safe going back to the place where you live?: Yes      Need for Family  Participation in Patient Care: Yes (Comment) Care giver support system in place?: Yes (comment)   Criminal Activity/Legal Involvement Pertinent to Current Situation/Hospitalization: No - Comment as needed  Activities of Daily Living      Permission Sought/Granted   Permission granted to share information with : Yes, Verbal Permission Granted     Permission granted to share info w AGENCY: Civil engineer, contracting of [employer]        Emotional Assessment Appearance:: Appears stated age Attitude/Demeanor/Rapport: Engaged Affect (typically observed): Accepting Orientation: : Oriented to Self, Oriented to Place      Admission diagnosis:  AKI (acute kidney injury) (HCC) [N17.9] Hypertensive emergency [I16.1] Hypertension, unspecified type [I10] Patient Active Problem List   Diagnosis Date Noted  . Acute diastolic CHF (congestive heart failure) (HCC) 12/23/2019  . IDA (iron deficiency anemia) 12/23/2019  . Acute respiratory failure with hypoxia (HCC) 12/22/2019  . Pleural effusion 12/22/2019  . Hypertensive emergency 12/21/2019  . Acute on chronic kidney failure (HCC) 12/21/2019  . Hypercholesteremia   . HTN (hypertension)   . Type 2 diabetes mellitus with other specified complication (HCC)   . Vitamin D deficiency   . Allergic rhinitis   . Osteoarthritis   . Hyperlipidemia   . Plantar fasciitis   . Gout   . Osteopenia   . CKD (chronic kidney disease)  PCP:  Maurice Small, MD Pharmacy:   [facility] [address] [place] - [address] [place] [address] Phone: [phone] Fax: [phone]     Social Determinants of Health (SDOH) Interventions    Readmission Risk Interventions No flowsheet data found.  Quintella Baton, RN, BSN  Trauma/Neuro ICU Case Manager [phone]

## 2019-12-25 NOTE — Progress Notes (Signed)
[facility] ROUNDING NOTE   Subjective:   Brief history This is an 85 year old lady with a history of hypertension diabetes mellitus type 2 hyperlipidemia chronic kidney disease gout.  Baseline serum creatinine appears to be about 1.2 to 1.3 mg/dL.  She has recurrent uncontrolled hypertension.  She is followed by Dr. Candiss Norse at [facility]  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  Secondary work-up including SPEP hepatitis B kappa lambda light chain ratio hepatitis C and HIV are unremarkable.  CPK elevated 467.  Biopsy placed on hold secondary to abrupt drop in hemoglobin.  Status post transfusion 1 unit packed red blood cells 12/23/2019.  Not sure that a renal biopsy would assist with therapy at this time.    .  Prognosis is going to be poor.  Family meeting to be arranged 12/25/2019 to discuss options.  Nuclear study was negative for amyloid  Blood pressure 174/43 pulse 85 temperature 98.3 O2 sats 9 9% 2 L nasal cannula  Sodium 144 potassium 4.9 chloride 116 CO2 17 glucose 117 BUN 60 creatinine 6.14 calcium 8.4 hemoglobin 8.2  Objective:  Vital signs in last 24 hours:  Temp:  [98.3 F (36.8 C)-99.4 F (37.4 C)] 98.3 F (36.8 C) (11/17 0724) Pulse Rate:  [62-155] 83 (11/17 0724) Resp:  [10-30] 18 (11/17 0724) BP: (113-196)/(36-128) 174/43 (11/17 0724) SpO2:  [94 %-100 %] 99 % (11/17 0724)  Weight change:  Filed Weights   12/21/19 0839  Weight: 50.8 kg    Intake/Output: I/O last 3 completed shifts: In: 1585.2 [P.O.:480; I.V.:1105.2] Out: 575 [Urine:575]   Intake/Output this shift:  No intake/output data recorded.  General:NAD, comfortable Heart:RRR, s1s2 nl, no rubs Lungs: Basal decreased breath sound, no increased work of breathing Abdomen:soft, Non-tender, non-distended Extremities:No edema Neurology: Alert awake and following commands.   Basic Metabolic Panel: Recent Labs   Lab 12/21/19 0930 12/21/19 0930 12/22/19 0225 12/22/19 0225 12/23/19 0632 12/24/19 0358 12/25/19 0054  NA 145  --  145  --  143 144 144  K 4.3  --  4.4  --  4.7 4.7 4.9  CL 113*  --  115*  --  115* 115* 116*  CO2 21*  --  21*  --  20* 17* 17*  GLUCOSE 93  --  115*  --  102* 113* 117*  BUN 52*  --  50*  --  53* 56* 60*  CREATININE 5.54*  --  5.48*  --  5.81* 6.07* 6.14*  CALCIUM 8.5*   < > 8.1*   < > 8.0* 8.3* 8.4*  MG  --   --   --   --  1.6* 1.7 1.6*  PHOS  --   --  4.7*  --  4.6 5.5* 5.4*   < > = values in this interval not displayed.    Liver Function Tests: Recent Labs  Lab 12/22/19 0225 12/22/19 1428  AST  --  15  ALT  --  11  ALKPHOS  --  34*  BILITOT  --  0.3  PROT  --  4.4*  ALBUMIN 2.1* 2.0*   No results for input(s): LIPASE, AMYLASE in the last 168 hours. No results for input(s): AMMONIA in the last 168 hours.  CBC: Recent Labs  Lab 12/21/19 0930 12/23/19 0632 12/23/19 2123 12/24/19 0358 12/25/19 0054  WBC 5.8 8.6 12.7* 10.0 9.1  NEUTROABS 4.2 6.6  --  8.0* 7.5  HGB 8.3* 6.2* 8.7*  8.4* 8.2*  HCT 26.6* 19.5* 26.5* 26.0* 25.0*  MCV 85.8 85.5 84.7 85.8 85.9  PLT 278 248 248 238 232    Cardiac Enzymes: No results for input(s): CKTOTAL, CKMB, CKMBINDEX, TROPONINI in the last 168 hours.  BNP: Invalid input(s): POCBNP  CBG: Recent Labs  Lab 12/23/19 1705 12/23/19 2149 12/24/19 0816 12/24/19 1730 12/24/19 2112  GLUCAP 93 145* 89 131* 139*    Microbiology: Results for orders placed or performed during the hospital encounter of 12/21/19  Respiratory Panel by RT PCR (Flu A&B, Covid) - Nasopharyngeal Swab     Status: None   Collection Time: 12/21/19  9:30 AM   Specimen: Nasopharyngeal Swab  Result Value Ref Range Status   SARS Coronavirus 2 by RT PCR NEGATIVE NEGATIVE Final    Comment: (NOTE) SARS-CoV-2 target nucleic acids are NOT DETECTED.  The SARS-CoV-2 RNA is generally detectable in upper respiratoy specimens during the acute phase  of infection. The lowest concentration of SARS-CoV-2 viral copies this assay can detect is 131 copies/mL. A negative result does not preclude SARS-Cov-2 infection and should not be used as the sole basis for treatment or other patient management decisions. A negative result may occur with  improper specimen collection/handling, submission of specimen other than nasopharyngeal swab, presence of viral mutation(s) within the areas targeted by this assay, and inadequate number of viral copies (<131 copies/mL). A negative result must be combined with clinical observations, patient history, and epidemiological information. The expected result is Negative.  Fact Sheet for Patients:  https://www.moore.com/  Fact Sheet for Healthcare Providers:  https://www.young.biz/  This test is no t yet approved or cleared by the [place] FDA and  has been authorized for detection and/or diagnosis of SARS-CoV-2 by FDA under an Emergency Use Authorization (EUA). This EUA will remain  in effect (meaning this test can be used) for the duration of the COVID-19 declaration under Section 564(b)(1) of the Act, 21 U.S.C. section 360bbb-3(b)(1), unless the authorization is terminated or revoked sooner.     Influenza A by PCR NEGATIVE NEGATIVE Final   Influenza B by PCR NEGATIVE NEGATIVE Final    Comment: (NOTE) The Xpert Xpress SARS-CoV-2/FLU/RSV assay is intended as an aid in  the diagnosis of influenza from Nasopharyngeal swab specimens and  should not be used as a sole basis for treatment. Nasal washings and  aspirates are unacceptable for Xpert Xpress SARS-CoV-2/FLU/RSV  testing.  Fact Sheet for Patients: https://www.moore.com/  Fact Sheet for Healthcare Providers: https://www.young.biz/  This test is not yet approved or cleared by the [place] FDA and  has been authorized for detection and/or diagnosis of  SARS-CoV-2 by  FDA under an Emergency Use Authorization (EUA). This EUA will remain  in effect (meaning this test can be used) for the duration of the  Covid-19 declaration under Section 564(b)(1) of the Act, 21  U.S.C. section 360bbb-3(b)(1), unless the authorization is  terminated or revoked. Performed at [facility], [address] [place], [place] [address]   MRSA PCR Screening     Status: None   Collection Time: 12/21/19  3:48 PM   Specimen: Nasal Mucosa; Nasopharyngeal  Result Value Ref Range Status   MRSA by PCR NEGATIVE NEGATIVE Final    Comment:        The GeneXpert MRSA Assay (FDA approved for NASAL specimens only), is one component of a comprehensive MRSA colonization surveillance program. It is not intended to diagnose MRSA infection nor to guide or monitor treatment for MRSA  infections. Performed at [facility], [address] [place], [place] [address]   Urine Culture     Status: Abnormal   Collection Time: 12/21/19  8:37 PM   Specimen: Urine, Random  Result Value Ref Range Status   Specimen Description URINE, RANDOM  Final   Special Requests   Final    NONE Performed at [facility], [address], [place], [place] [address]    Culture 30,000 COLONIES/mL ESCHERICHIA COLI (A)  Final   Report Status 12/24/2019 FINAL  Final   Organism ID, Bacteria ESCHERICHIA COLI (A)  Final      Susceptibility   Escherichia coli - MIC*    AMPICILLIN <=2 SENSITIVE Sensitive     CEFAZOLIN <=4 SENSITIVE Sensitive     CEFEPIME <=0.12 SENSITIVE Sensitive     CEFTRIAXONE <=0.25 SENSITIVE Sensitive     CIPROFLOXACIN <=0.25 SENSITIVE Sensitive     GENTAMICIN <=1 SENSITIVE Sensitive     IMIPENEM <=0.25 SENSITIVE Sensitive     NITROFURANTOIN <=16 SENSITIVE Sensitive     TRIMETH/SULFA <=20 SENSITIVE Sensitive     AMPICILLIN/SULBACTAM <=2 SENSITIVE Sensitive     PIP/TAZO <=4 SENSITIVE Sensitive     * 30,000 COLONIES/mL ESCHERICHIA COLI  Culture, body  fluid-bottle     Status: None (Preliminary result)   Collection Time: 12/22/19 12:16 PM   Specimen: Peritoneal Washings  Result Value Ref Range Status   Specimen Description PERITONEAL FLUID  Final   Special Requests NONE  Final   Culture   Final    NO GROWTH 2 DAYS Performed at [facility], [address] [place], [place] [address]    Report Status PENDING  Incomplete  Gram stain     Status: None   Collection Time: 12/22/19 12:16 PM   Specimen: Peritoneal Washings  Result Value Ref Range Status   Specimen Description PERITONEAL FLUID  Final   Special Requests NONE  Final   Gram Stain   Final    WBC PRESENT,BOTH PMN AND MONONUCLEAR NO ORGANISMS SEEN CYTOSPIN SMEAR Performed at [facility], [address] [place], [place] [address]    Report Status 12/22/2019 FINAL  Final    Coagulation Studies: Recent Labs    12/24/19 0738  LABPROT 14.1  INR 1.1    Urinalysis: No results for input(s): COLORURINE, LABSPEC, PHURINE, GLUCOSEU, HGBUR, BILIRUBINUR, KETONESUR, PROTEINUR, UROBILINOGEN, NITRITE, LEUKOCYTESUR in the last 72 hours.  Invalid input(s): APPERANCEUR    Imaging: NM CARDIAC AMYLOID TUMOR LOC INFLAM SPECT 1 DAY  Result Date: 12/24/2019 CLINICAL DATA:  HEART FAILURE. CONCERN FOR CARDIAC AMYLOIDOSIS. 85 year old female EXAM: NUCLEAR MEDICINE TUMOR LOCALIZATION. PYP CARDIAC AMYLOIDOSIS SCAN WITH SPECT TECHNIQUE: Following intravenous administration of radiopharmaceutical, anterior planar images of the chest were obtained. Regions of interest were placed on the heart and contralateral chest wall for quantitative assessment. Additional SPECT imaging of the chest was obtained. RADIOPHARMACEUTICALS:  21.5 mCi TECHNETIUM 99 PYROPHOSPHATE FINDINGS: Planar Visual assessment: Anterior planar imaging demonstrates radiotracer uptake within the heart less than uptake within the adjacent ribs (Grade 1). Quantitative assessment : Quantitative assessment of the cardiac  uptake compared to the contralateral chest wall is equal to 1.0 (H/CL = 1.0). SPECT assessment: SPECT imaging of the chest demonstrates trace radiotracer accumulation within the LEFT ventricle. IMPRESSION: Visual and quantitative assessment (grade 1, H/CL equal 1.0) are NOT suggestive of transthyretin amyloidosis. Electronically Signed   By: Genevive Bi M.D.   On: 12/24/2019 12:45   VAS US RENAL  ARTERY DUPLEX  Result Date: 12/23/2019 ABDOMINAL VISCERAL Indications: Hypertension, AKI High Risk Factors: Hypertension, hyperlipidemia, Diabetes. Limitations: Body habitus. Comparison Study: No prior study Performing Technologist: Gertie Fey MHA, RDMS, RVT, RDCS  Examination Guidelines: A complete evaluation includes B-mode imaging, spectral Doppler, color Doppler, and power Doppler as needed of all accessible portions of each vessel. Bilateral testing is considered an integral part of a complete examination. Limited examinations for reoccurring indications may be performed as noted.  Duplex Findings: +--------------------+--------+--------+------+------------------+ Mesenteric          PSV cm/sEDV cm/sPlaque     Comments      +--------------------+--------+--------+------+------------------+ Aorta Prox            150      10                            +--------------------+--------+--------+------+------------------+ Celiac Artery Origin   49                                    +--------------------+--------+--------+------+------------------+ SMA Proximal                              Unable to insonate +--------------------+--------+--------+------+------------------+    +------------------+--------+--------+-------+ Right Renal ArteryPSV cm/sEDV cm/sComment +------------------+--------+--------+-------+ Origin              150      25           +------------------+--------+--------+-------+ Proximal             59                    +------------------+--------+--------+-------+ Mid                  63                   +------------------+--------+--------+-------+ Distal               69                   +------------------+--------+--------+-------+ +-----------------+--------+--------+-------+ Left Renal ArteryPSV cm/sEDV cm/sComment +-----------------+--------+--------+-------+ Origin             131      17           +-----------------+--------+--------+-------+ Proximal            20      4            +-----------------+--------+--------+-------+ Mid                 46                   +-----------------+--------+--------+-------+ Distal              31                   +-----------------+--------+--------+-------+ +------------+--------+--------+--+-----------+--------+--------+----+ Right KidneyPSV cm/sEDV cm/sRILeft KidneyPSV cm/sEDV cm/sRI   +------------+--------+--------+--+-----------+--------+--------+----+ Upper Pole  19                Upper Pole 16                   +------------+--------+--------+--+-----------+--------+--------+----+ Mid         18                Mid  17                   +------------+--------+--------+--+-----------+--------+--------+----+ Lower Pole  14                Lower Pole 20      4       0.80 +------------+--------+--------+--+-----------+--------+--------+----+ Hilar       53                Hilar      17                   +------------+--------+--------+--+-----------+--------+--------+----+ +------------------+---------+------------------+---------+ Right Kidney               Left Kidney                 +------------------+---------+------------------+---------+ RAR                        RAR                         +------------------+---------+------------------+---------+ RAR (manual)      1.0      RAR (manual)      0.87      +------------------+---------+------------------+---------+ Cortex             18/4 cm/sCortex            19/0 cm/s +------------------+---------+------------------+---------+ Cortex thickness           Corex thickness             +------------------+---------+------------------+---------+ Kidney length (cm)9.13     Kidney length (cm)8.14      +------------------+---------+------------------+---------+  Summary: Renal:  Bilateral: No evidence of hemodynamically significant renal artery            stenosis. High resistant waveforms are noted throughout            bilateral kidneys. This along with echogenic bilateral            kidneys is suggestive of medical renal disease;            correlates with recent renal ultrasound.  Incidental finding: bilateral pleural effusions are visualized.  *See table(s) above for measurements and observations.  Diagnosing physician: Servando Snare MD  Electronically signed by Servando Snare MD on 12/23/2019 at 4:31:57 PM.    Final      Medications:   . sodium chloride Stopped (12/25/19 0257)  . niCARDipine Stopped (12/25/19 0019)   . amLODipine  10 mg Oral Daily  . Chlorhexidine Gluconate Cloth  6 each Topical Daily  . cloNIDine  0.2 mg Oral BID  . darbepoetin (ARANESP) injection - NON-DIALYSIS  200 mcg Subcutaneous Q Tue-1800  . doxazosin  8 mg Oral Daily  . hydrALAZINE  100 mg Oral TID  . insulin aspart  0-5 Units Subcutaneous QHS  . insulin aspart  0-6 Units Subcutaneous TID WC  . pantoprazole  40 mg Oral Daily  . sodium chloride flush  3 mL Intravenous Q12H   acetaminophen **OR** acetaminophen, ipratropium-albuterol  Assessment/ Plan:  1. 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 Kidney ultrasound with echogenic kidneys however incidental finding of pleural effusion. It appears that the heart scan is negative for amyloidosis.  This certainly would have been a unifying diagnosis.   Prognosis is going to be poor and do not favor initiation of hemodialysis.  Would  recommend hospice care.  Will discuss with family 12/25/2019   2. Nephrotic range proteinuria, concerning for FSGS, collapsing variant. Around 10 g of proteinuria. Secondary work-up including SPEP, kappa lambda ratio, hep B, hep C, HIV unremarkable. CPK only elevated to 467. Repeat UA with no RBC, 9.2 g of protein. Pending ANA, ANCA, C3, C4, anti-GBM. Nuclear study does not suggest amyloidosis.  However this would be a unifying diagnosis.  3.Hypertensive urgency: Amlodipine dose increased to 10 mg.  Will increase hydralazine and doxazosin.  4.Anemia: Iron saturation 12% status post transfusio.  ESA added  5.Bilateral moderate to large pleural effusion seen on kidney ultrasound: She is requiring oxygen this morning.  Some shortness of breath.  I have discussed this finding with primary team.  Getting x-ray and possible thoracocentesis by IR.  6.  Congestive heart failure diastolic dysfunction   LOS: 4 Sherril Croon @TODAY @7 :26 AM

## 2019-12-25 NOTE — Progress Notes (Signed)
Pt states she is comfortable, denies pain. Son [place] at bedside. Discussed careplan with case management at rounds.

## 2019-12-25 NOTE — Progress Notes (Signed)
D/C to home deferred to tomorrow as equipment has been delivered to her home yet.

## 2019-12-25 NOTE — Progress Notes (Addendum)
Progress Note  Patient Name: Valerie LessenFannie B Bonilla Date of Encounter: 12/25/2019  Primary Cardiologist: No primary care provider on file.   Subjective   Patient resting in bed. She denies any new complaints today. I informed her of her PYP test results and discussed her prognosis. She is concerned about her son, Valerie Bonilla who has not been able to visit her and is upset regarding the recent downtrend in his mothers health.   Inpatient Medications    Scheduled Meds:  amLODipine  10 mg Oral Daily   Chlorhexidine Gluconate Cloth  6 each Topical Daily   cloNIDine  0.2 mg Oral BID   darbepoetin (ARANESP) injection - NON-DIALYSIS  200 mcg Subcutaneous Q Tue-1800   doxazosin  8 mg Oral Daily   hydrALAZINE  100 mg Oral TID   insulin aspart  0-5 Units Subcutaneous QHS   insulin aspart  0-6 Units Subcutaneous TID WC   pantoprazole  40 mg Oral Daily   sodium chloride flush  3 mL Intravenous Q12H   Continuous Infusions:  sodium chloride Stopped (12/25/19 0257)   niCARDipine Stopped (12/25/19 0019)   PRN Meds: acetaminophen **OR** acetaminophen, ipratropium-albuterol   Vital Signs    Vitals:   12/25/19 0319 12/25/19 0400 12/25/19 0500 12/25/19 0600  BP: (!) 172/53 (!) 132/43 (!) 139/50 (!) 153/50  Pulse: 87 74 69 73  Resp: 15 11 11  (!) 21  Temp:  98.7 F (37.1 C)    TempSrc:  Oral    SpO2:  99% 99% 98%  Weight:      Height:        Intake/Output Summary (Last 24 hours) at 12/25/2019 0646 Last data filed at 12/25/2019 0600 Gross per 24 hour  Intake 1197.41 ml  Output 175 ml  Net 1022.41 ml   Filed Weights   12/21/19 0839  Weight: 50.8 kg    Telemetry    Patient had a run of atrial fibrillation with RVR overnight, but is currently in sinus rhythm. EKG was not performed at that time. - Personally Reviewed  ECG    Not performed today - Personally Reviewed  Physical Exam  GEN: No acute distress.   Neck: No JVD Cardiac: RRR, 3/6 blowing systolic murmur best heard at the left  lower sternal boarder and apex  Respiratory: mild bibasilar crackles worse on the left. GI: Soft, nontender, non-distended  MS: No edema; No deformity. Neuro:  Nonfocal  Psych: Normal affect   Labs    Chemistry Recent Labs  Lab 12/22/19 0225 12/22/19 0225 12/22/19 1428 12/23/19 [id number] 12/24/19 0358 12/25/19 0054  NA 145   < >  --  143 144 144  K 4.4   < >  --  4.7 4.7 4.9  CL 115*   < >  --  115* 115* 116*  CO2 21*   < >  --  20* 17* 17*  GLUCOSE 115*   < >  --  102* 113* 117*  BUN 50*   < >  --  53* 56* 60*  CREATININE 5.48*   < >  --  5.81* 6.07* 6.14*  CALCIUM 8.1*   < >  --  8.0* 8.3* 8.4*  PROT  --   --  4.4*  --   --   --   ALBUMIN 2.1*  --  2.0*  --   --   --   AST  --   --  15  --   --   --   ALT  --   --  11  --   --   --   ALKPHOS  --   --  34*  --   --   --   BILITOT  --   --  0.3  --   --   --   GFRNONAA 7*   < >  --  7* 6* 6*  ANIONGAP 9   < >  --  8 12 11    < > = values in this interval not displayed.     Hematology Recent Labs  Lab 12/23/19 2123 12/24/19 0358 12/25/19 0054  WBC 12.7* 10.0 9.1  RBC 3.13* 3.03* 2.91*  HGB 8.7* 8.4* 8.2*  HCT 26.5* 26.0* 25.0*  MCV 84.7 85.8 85.9  MCH 27.8 27.7 28.2  MCHC 32.8 32.3 32.8  RDW 14.8 15.1 15.3  PLT 248 238 232    Cardiac EnzymesNo results for input(s): TROPONINI in the last 168 hours. No results for input(s): TROPIPOC in the last 168 hours.   BNP Recent Labs  Lab 12/23/19 2123  BNP 523.9*     DDimer No results for input(s): DDIMER in the last 168 hours.   Radiology    NM CARDIAC AMYLOID TUMOR LOC INFLAM SPECT 1 DAY  Result Date: 12/24/2019 CLINICAL DATA:  HEART FAILURE. CONCERN FOR CARDIAC AMYLOIDOSIS. 85 year old female EXAM: NUCLEAR MEDICINE TUMOR LOCALIZATION. PYP CARDIAC AMYLOIDOSIS SCAN WITH SPECT TECHNIQUE: Following intravenous administration of radiopharmaceutical, anterior planar images of the chest were obtained. Regions of interest were placed on the heart and contralateral chest  wall for quantitative assessment. Additional SPECT imaging of the chest was obtained. RADIOPHARMACEUTICALS:  21.5 mCi TECHNETIUM 99 PYROPHOSPHATE FINDINGS: Planar Visual assessment: Anterior planar imaging demonstrates radiotracer uptake within the heart less than uptake within the adjacent ribs (Grade 1). Quantitative assessment : Quantitative assessment of the cardiac uptake compared to the contralateral chest wall is equal to 1.0 (H/CL = 1.0). SPECT assessment: SPECT imaging of the chest demonstrates trace radiotracer accumulation within the LEFT ventricle. IMPRESSION: Visual and quantitative assessment (grade 1, H/CL equal 1.0) are NOT suggestive of transthyretin amyloidosis. Electronically Signed   By: 91 M.D.   On: 12/24/2019 12:45   VAS 12/26/2019 RENAL ARTERY DUPLEX  Result Date: 12/23/2019 ABDOMINAL VISCERAL Indications: Hypertension, AKI High Risk Factors: Hypertension, hyperlipidemia, Diabetes. Limitations: Body habitus. Comparison Study: No prior study Performing Technologist: 12/25/2019 MHA, RDMS, RVT, RDCS  Examination Guidelines: A complete evaluation includes B-mode imaging, spectral Doppler, color Doppler, and power Doppler as needed of all accessible portions of each vessel. Bilateral testing is considered an integral part of a complete examination. Limited examinations for reoccurring indications may be performed as noted.  Duplex Findings: +--------------------+--------+--------+------+------------------+ Mesenteric          PSV cm/sEDV cm/sPlaque     Comments      +--------------------+--------+--------+------+------------------+ Aorta Prox            150      10                            +--------------------+--------+--------+------+------------------+ Celiac Artery Origin   49                                    +--------------------+--------+--------+------+------------------+ SMA Proximal  Unable to insonate  +--------------------+--------+--------+------+------------------+    +------------------+--------+--------+-------+ Right Renal ArteryPSV cm/sEDV cm/sComment +------------------+--------+--------+-------+ Origin              150      25           +------------------+--------+--------+-------+ Proximal             59                   +------------------+--------+--------+-------+ Mid                  63                   +------------------+--------+--------+-------+ Distal               69                   +------------------+--------+--------+-------+ +-----------------+--------+--------+-------+ Left Renal ArteryPSV cm/sEDV cm/sComment +-----------------+--------+--------+-------+ Origin             131      17           +-----------------+--------+--------+-------+ Proximal            20      4            +-----------------+--------+--------+-------+ Mid                 46                   +-----------------+--------+--------+-------+ Distal              31                   +-----------------+--------+--------+-------+ +------------+--------+--------+--+-----------+--------+--------+----+ Right KidneyPSV cm/sEDV cm/sRILeft KidneyPSV cm/sEDV cm/sRI   +------------+--------+--------+--+-----------+--------+--------+----+ Upper Pole  19                Upper Pole 16                   +------------+--------+--------+--+-----------+--------+--------+----+ Mid         18                Mid        17                   +------------+--------+--------+--+-----------+--------+--------+----+ Lower Pole  14                Lower Pole 20      4       0.80 +------------+--------+--------+--+-----------+--------+--------+----+ Hilar       53                Hilar      17                   +------------+--------+--------+--+-----------+--------+--------+----+ +------------------+---------+------------------+---------+ Right Kidney                Left Kidney                 +------------------+---------+------------------+---------+ RAR                        RAR                         +------------------+---------+------------------+---------+ RAR (manual)      1.0      RAR (manual)      0.87      +------------------+---------+------------------+---------+ Cortex  18/4 cm/sCortex            19/0 cm/s +------------------+---------+------------------+---------+ Cortex thickness           Corex thickness             +------------------+---------+------------------+---------+ Kidney length (cm)9.13     Kidney length (cm)8.14      +------------------+---------+------------------+---------+  Summary: Renal:  Bilateral: No evidence of hemodynamically significant renal artery            stenosis. High resistant waveforms are noted throughout            bilateral kidneys. This along with echogenic bilateral            kidneys is suggestive of medical renal disease;            correlates with recent renal ultrasound.  Incidental finding: bilateral pleural effusions are visualized.  *See table(s) above for measurements and observations.  Diagnosing physician: Lemar Livings MD  Electronically signed by Lemar Livings MD on 12/23/2019 at 4:31:57 PM.    Final     Cardiac Studies   TTE (12/22/19)  1. Left ventricular ejection fraction, by estimation, is 60 to 65%. The  left ventricle has normal function. The left ventricle has no regional  wall motion abnormalities. There is severe concentric left ventricular  hypertrophy. Left ventricular diastolic   parameters are consistent with Grade II diastolic dysfunction  (pseudonormalization). Elevated left ventricular end-diastolic pressure.   2. Right ventricular systolic function is normal. The right ventricular  size is normal. There is mildly elevated pulmonary artery systolic  pressure. The estimated right ventricular systolic pressure is 37.8 mmHg.   3. Left  atrial size was severely dilated.   4. A small pericardial effusion is present. The pericardial effusion is  circumferential.   5. The mitral valve is degenerative. Mild mitral valve regurgitation. No  evidence of mitral stenosis.   6. The aortic valve is tricuspid. Aortic valve regurgitation is trivial.  Mild aortic valve sclerosis is present, with no evidence of aortic valve  stenosis.   7. The inferior vena cava is dilated in size with >50% respiratory  variability, suggesting right atrial pressure of 8 mmHg.   8. The LV myocardium is severly thickened with a speckeled pattern. In  the setting of pericardial effusion and severe LA enlargement, would  consider possible amyloidosis. Consider PYP scan or cardiac MRI if  clinically indicated.    MV peak gradient, 6.4 mmHg.  The mean mitral valve gradient is 2.0 mmHg.  Patient Profile     PETINA MURASKI is a 85 y.o. female with a hx of HTN, DMII, HLD, CKD, gout who is being seen today for the evaluation of left ventricular hypertrophy at the request of Dr. Lewie Chamber.  Assessment & Plan    1. Heart Failure with preserved ejection fractions with LVH:  Patient presents with echo findings concerning for cardiac amyloidosis with negative PYP scan. Considering the patient's echo results there is high suspicion for amyloidosis. Patient is too tenuous to undergo further imaging with Cardiac MRI. Furthermore, I do not believe this patient would be a good candidate for cardiac amyloidosis treatment. She does not appear significantly hypervolemic today, but is 3L up from admission without any significant urine output despite trial of furosemide. Will defer to nephrology for further diuretic use. - Patient would likely benefit form a palliative care consult at this time. - Strict INO and daily weights  2. Atrial tachyarrhythmia : Patient had an episode of  atrial fibrillation with RVR overnight likely secondary to her chronic mitral  regurgitation in the setting of increased fluid retention due to oliguric renal failure. EKG was not performed at that time and she is currently in sinus rhythm on telemetry. Will continue to monitor closely.  3. HTN Emergency: - Patient's blood pressure was previously well controlled, but has since had episodes of hypertension in the 150-170 SBP. Hydralazine increased to 100 mg TID. She is currently taking amlodipine 10 mg, Clonidine 0.2 mg BID, doxazosin 2 mg daily, hydralazine 100 mg TID and Nicardipine gtt.  - Agree with titrating antihypertensive medications as needed to remain normotensive.   4. IDA: - Patients symptoms have significantly improved since her transfusion. - Hgb trend as follows 8.3>6.2>8.4>8.3 - Iron studies shows ferritin of 66, iron of 24, and saturation ratio of 12% . - still no signs of bleeding, FOBT pending   5. Acute respiratory failure with hypoxia s/p thoracentesis (11/14): - Patient is mildly short of breath today. - Fluid analysis shows exudative effusion.  - Management per primary team   6. Acute on chronic kidney failure with oliguria: - Patient presented with acute decompensation of kidney function in the setting of HTN emergency - Patient's presentation is likely consistent with nephrotic syndrome 2/2 amyloidosis despite negative PYP scan.  - I agree that she is not a good candidate for hemodialysis and would benefit from palliative care.   - Nephrology currently managing.   For questions or updates, please contact [facility] Please consult [email or web address] for contact info under Cardiology/STEMI.      Signed, Dellia Cloud, MD  12/25/2019, 6:46 AM    Patient examined chart reviewed. Frail elderly black female No murmur decreased BS bilaterally base. PYP scan negative and with anemia and renal failure not a candidate for Rx cardiac amyloidosis anyway Palliative care consult agreed. Do not think dialysis appropriate Hb continues to be low 8.2  consider aranasp or other erythropoietin she is post transfusion already Suspect renal biopsy not appropriate either  Cardiology will sign off  Charlton Haws MD [facility]

## 2019-12-25 NOTE — Progress Notes (Signed)
IR consulted by Dr. Ronalee Belts for possible image-guided random renal biopsy.  Procedure on hold secondary to low hgb. Further chart check today revealed that patient no longer interested in pursuing further AKI work-up. In addition, has plans for possible D/C on hospice today. Will delete order for IR renal biopsy- please re-consult IR if procedure desired in future.  Please call IR with questions/concerns.   Waylan Boga Martise Waddell, PA-C 12/25/2019, 1:52 PM

## 2019-12-26 LAB — PHOSPHORUS: Phosphorus: 5.2 mg/dL — ABNORMAL HIGH (ref 2.5–4.6)

## 2019-12-26 LAB — CBC WITH DIFFERENTIAL/PLATELET
Abs Immature Granulocytes: 0.04 10*3/uL (ref 0.00–0.07)
Basophils Absolute: 0 10*3/uL (ref 0.0–0.1)
Basophils Relative: 0 %
Eosinophils Absolute: 0.3 10*3/uL (ref 0.0–0.5)
Eosinophils Relative: 4 %
HCT: 25.1 % — ABNORMAL LOW (ref 36.0–46.0)
Hemoglobin: 8.1 g/dL — ABNORMAL LOW (ref 12.0–15.0)
Immature Granulocytes: 0 %
Lymphocytes Relative: 8 %
Lymphs Abs: 0.7 10*3/uL (ref 0.7–4.0)
MCH: 28.2 pg (ref 26.0–34.0)
MCHC: 32.3 g/dL (ref 30.0–36.0)
MCV: 87.5 fL (ref 80.0–100.0)
Monocytes Absolute: 0.7 10*3/uL (ref 0.1–1.0)
Monocytes Relative: 8 %
Neutro Abs: 7.1 10*3/uL (ref 1.7–7.7)
Neutrophils Relative %: 80 %
Platelets: 252 10*3/uL (ref 150–400)
RBC: 2.87 MIL/uL — ABNORMAL LOW (ref 3.87–5.11)
RDW: 15.4 % (ref 11.5–15.5)
WBC: 8.9 10*3/uL (ref 4.0–10.5)
nRBC: 0 % (ref 0.0–0.2)

## 2019-12-26 LAB — BASIC METABOLIC PANEL
Anion gap: 9 (ref 5–15)
BUN: 62 mg/dL — ABNORMAL HIGH (ref 8–23)
CO2: 19 mmol/L — ABNORMAL LOW (ref 22–32)
Calcium: 8.3 mg/dL — ABNORMAL LOW (ref 8.9–10.3)
Chloride: 115 mmol/L — ABNORMAL HIGH (ref 98–111)
Creatinine, Ser: 6.59 mg/dL — ABNORMAL HIGH (ref 0.44–1.00)
GFR, Estimated: 6 mL/min — ABNORMAL LOW (ref >60)
Glucose, Bld: 88 mg/dL (ref 70–99)
Potassium: 4.9 mmol/L (ref 3.5–5.1)
Sodium: 143 mmol/L (ref 135–145)

## 2019-12-26 LAB — MAGNESIUM: Magnesium: 1.7 mg/dL (ref 1.7–2.4)

## 2019-12-26 MED ORDER — PANTOPRAZOLE SODIUM 40 MG PO TBEC: 40.0000 mg | DELAYED_RELEASE_TABLET | Freq: Every day | ORAL | 0 refills | Status: AC

## 2019-12-26 MED ORDER — DOXAZOSIN MESYLATE 8 MG PO TABS: 8.0000 mg | ORAL_TABLET | Freq: Every day | ORAL | 0 refills | Status: AC

## 2019-12-26 MED ORDER — HYDRALAZINE HCL 100 MG PO TABS: 100.0000 mg | ORAL_TABLET | Freq: Three times a day (TID) | ORAL | 2 refills | Status: AC

## 2019-12-26 MED ORDER — AMLODIPINE BESYLATE 10 MG PO TABS: 10.0000 mg | ORAL_TABLET | Freq: Every day | ORAL | 0 refills | Status: AC

## 2019-12-26 NOTE — TOC Transition Note (Signed)
Transition of Care [facility]) - CM/SW Discharge Note   Patient Details  Name: Valerie Bonilla MRN: [id number] Date of Birth: 1934/02/18  [facility] CM/SW Contact:  Glennon Mac, RN Phone Number: 12/26/2019, 12:04 PM   Clinical Narrative:  Pt medically stable for discharge home today with family and Home Hospice services as arranged.  Portable O2 tank and RW delivered to room, and per son, DME has been delivered to home.  [facility] to follow up with pt upon dc home.  Out of facility DNR form to be sent with pt.       Final next level of care: Home w Hospice Care Barriers to Discharge: Barriers Resolved   Patient Goals and CMS Choice Patient states their goals for this hospitalization and ongoing recovery are:: go home CMS Medicare.gov Compare Post Acute Care list provided to:: Patient Represenative (must comment) (son [name]) Choice offered to / list presented to : Adult Children                        Discharge Plan and Services   Discharge Planning Services: CM Consult Post Acute Care Choice: Hospice                    HH Arranged: RN, Social Work HH Agency:  Photographer) Date HH Agency Contacted: 12/25/19 Time HH Agency Contacted: 1235 Representative spoke with at [facility] Agency: Community education officer  Social Determinants of Health (SDOH) Interventions     Readmission Risk Interventions Readmission Risk Prevention Plan 12/26/2019  Transportation Screening Complete  PCP or Specialist Appt within 5-7 Days Complete  Home Care Screening Complete  Medication Review (RN CM) Complete  Some recent data might be hidden   Quintella Baton, RN, BSN  Trauma/Neuro ICU Case Manager [phone]

## 2019-12-26 NOTE — Discharge Summary (Signed)
Physician Discharge Summary  Valerie Bonilla ZOX:[id number] DOB: 09-Sep-1934 DOA: 12/21/2019  PCP: Valerie SmallGriffin, Elaine, MD  Admit date: 12/21/2019 Discharge date: 12/26/2019  Admitted From: Home Discharge disposition: Home with home hospice   Code Status: DNR  Diet Recommendation: As tolerated  Discharge Diagnosis:   Active Problems:   Type 2 diabetes mellitus with other specified complication (HCC)   Hyperlipidemia   Hypertensive emergency   Acute on chronic kidney failure (HCC)   Acute respiratory failure with hypoxia (HCC)   Pleural effusion   Acute diastolic CHF (congestive heart failure) (HCC)   IDA (iron deficiency anemia)  History of Present Illness / Brief narrative:  Valerie Bonilla is a 85 y.o. female who was sent to the ED on 12/21/2019 for worsening creatinine level. PMH significant for resistant HTN, DMII, HLD, CKD (unknown stage), goutShe follows with Dr. Thedore Bonilla and baseline creat is 1.2-1.3.  [facility] [place] [facility] [place] [facility] 5. Further evaluation showed nephrotic range proteinuria around 10 g without any microscopic hematuria. SPEP, kappa lambda ratio, hepatitis B, C, HIV was unremarkable.  She was however found to have systolic blood pressure >200therefore directed to the hospital for better blood pressure control with the plan of kidney biopsy.  On assessment in the ER her BP was 210s-250s/70s-100s). She states compliance at home with her BP regimen without any missed doses recently (at home on amlodipine, clonidine, doxazosin, toprol).  She was started on nicardipine drip given excessively elevated pressures.   The labs showed BUN 52, creatinine 5.54, CO2 21, hemoglobin 8.3. Covid negative.  Nephrology and IR planned for renal biopsy after admission and BP stabilization.  After discussion with family by nephrology and primary team, family decided not to go ahead with renal biopsy/dialysis and made a  decision to take her home with hospice care.  Subjective:  Seen and examined this morning.  Pleasant elderly African-American female.  Wants to go home today.  Getting discharged on hospice services.  Hospital Course:  Hypertensive emergency -Initially started on Cardene drip.  Home medicines were continued.   -With the plan of hospice care, Cardene drip was stopped.  Okay to continue other oral blood pressure medicines at home  Acute on chronic kidney failure -CKD stage unknown; no prior values or notes available for review -baseline creat 1.2 - 1.3 per nephrology. Creatinine continues to uptrend.  -concern is for FSGS or other glomerular disease  -Renal ultrasound showed a Bonilla, echogenic kidneys; mild dilated collecting system -Renal biopsy was discussed but likely low yield.  Family decided not to proceed with it.  IDA (iron deficiency anemia) - ferritin 66, iron 24, sat ratio 12% - s/p nulecit started 250mg  daily on 11/14 - no obvious bleeding or large risk factors but Hgb has downtrended 8.3>>6.2 g/dL on 81/1911/15.  1 unit of PRBC was given.   Acute diastolic CHF (congestive heart failure) (HCC) - echo was obtained due to uncontrolled BP - EF 60-65%, Gr II DD, severe LVH, LA dilated;   Acute respiratory failure with hypoxia (HCC) -Likely due to left pleural effusion.  800 cc off pleural fluid tapped on 11/14. - wean O2 as able  Type 2 diabetes mellitus with other specified complication (HCC) -check A1c= 5% -continue SSI and CBGs  Hyperlipidemia -On statin at home.  Okay to discharge home today with hospice services.  Patient was to continue her usual medications okay to continue.  Wound care:    Discharge  Exam:   Vitals:   12/26/19 0600 12/26/19 0700 12/26/19 0800 12/26/19 0806  BP: (!) 167/50 (!) 163/48 (!) 188/52   Pulse: 76 73 85   Resp: 17 11 16    Temp:    98.7 F (37.1 C)  TempSrc:    Oral  SpO2: 100% 100% 98%   Weight:      Height:        Body  mass index is 21.16 kg/m.  General exam: Appears calm and comfortable.  Not in physical distress Skin: No rashes, lesions or ulcers. HEENT: Atraumatic, normocephalic, supple neck, no obvious bleeding Lungs: Clear to auscultation bilaterally CVS: Regular rate and rhythm, no murmur GI/Abd soft, nontender, nondistended, bowel sound present CNS: Alert, awake, oriented x3 Psychiatry: Mood appropriate Extremities: No pedal edema, no calf tenderness  Follow ups:   Discharge Instructions    Diet general   Complete by: As directed    Increase activity slowly   Complete by: As directed       Follow-up Information    Valerie Small, MD Follow up.   Specialty: Family Medicine Contact information: [address] [place] [address] [phone]               Recommendations for Outpatient Follow-Up:   1. Per hospice policy  Discharge Instructions:  Follow with Primary MD Valerie Small, MD in 7 days   Please note You were cared for by a hospitalist during your hospital stay. If you have any questions about your discharge medications or the care you received while you were in the hospital after you are discharged, you can call the unit and asked to speak with the hospitalist on call if the hospitalist that took care of you is not available. Once you are discharged, your primary care physician will handle any further medical issues. Please note that NO REFILLS for any discharge medications will be authorized once you are discharged, as it is imperative that you return to your primary care physician (or establish a relationship with a primary care physician if you do not have one) for your aftercare needs so that they can reassess your need for medications and monitor your lab values.    Allergies as of 12/26/2019      Reactions   Ace Inhibitors Cough      Medication List    STOP taking these medications   metoprolol succinate 100 MG 24 hr tablet Commonly  known as: TOPROL-XL   simvastatin 20 MG tablet Commonly known as: ZOCOR     TAKE these medications   amLODipine 10 MG tablet Commonly known as: NORVASC Take 1 tablet (10 mg total) by mouth daily. What changed:   medication strength  how much to take   cholecalciferol 25 MCG (1000 UNIT) tablet Commonly known as: VITAMIN D3 Take 1,000 Units by mouth daily.   cloNIDine 0.2 MG tablet Commonly known as: CATAPRES Take 1 tablet (0.2 mg total) by mouth 2 (two) times daily. What changed: when to take this   doxazosin 8 MG tablet Commonly known as: CARDURA Take 1 tablet (8 mg total) by mouth daily. What changed:   medication strength  how much to take   hydrALAZINE 100 MG tablet Commonly known as: APRESOLINE Take 1 tablet (100 mg total) by mouth 3 (three) times daily.   pantoprazole 40 MG tablet Commonly known as: PROTONIX Take 1 tablet (40 mg total) by mouth daily.   Vitamin D (Ergocalciferol) 1.25 MG (50000 UNIT) Caps capsule  Commonly known as: DRISDOL Take 50,000 Units by mouth every Monday.       Time coordinating discharge: 35 minutes  The results of significant diagnostics from this hospitalization (including imaging, microbiology, ancillary and laboratory) are listed below for reference.    Procedures and Diagnostic Studies:   DG Chest 1 View  Result Date: 12/22/2019 CLINICAL DATA:  Status post left thoracentesis. EXAM: CHEST  1 VIEW COMPARISON:  December 22, 2019 FINDINGS: Enlarged cardiac silhouette. Decreased left pleural effusion. Mild streaky airspace opacities in the left lung base. No radiographically apparent pneumothorax. Osseous structures are without acute abnormality. Soft tissues are grossly normal. IMPRESSION: 1. No radiographically apparent pneumothorax. 2. Decreased left pleural effusion. 3. Mild streaky airspace opacities in the left lung base. Electronically Signed   By: Ted Mcalpine M.D.   On: 12/22/2019 13:29   US RENAL  Result  Date: 12/21/2019 CLINICAL DATA:  Acute kidney injury EXAM: RENAL / URINARY TRACT ULTRASOUND COMPLETE COMPARISON:  None. FINDINGS: Right Kidney: Renal measurements: 7.2 x 3.4 x 3.8 cm = volume: 48 mL. There is increased cortical echogenicity. There appears to be mild collecting system dilatation. Left Kidney: Renal measurements: 7.7 x 4.4 x 3.7 cm = volume: 66 mL. There is increased cortical echogenicity with mild collecting system dilatation. Bladder: The ureteral jets were not visualized. Other: Incidentally noted are bilateral pleural effusions that appear to be at least moderate if not large in size. IMPRESSION: 1. Bonilla, echogenic kidneys bilaterally which can be seen in patients with medical renal disease. 2. Mild bilateral collecting system dilatation. 3. The ureteral jets were not visualized. 4. Incidentally noted moderate if not large bilateral pleural effusions. Electronically Signed   By: Katherine Mantle M.D.   On: 12/21/2019 19:05   DG CHEST PORT 1 VIEW  Result Date: 12/22/2019 CLINICAL DATA:  Pleural effusion EXAM: PORTABLE CHEST 1 VIEW COMPARISON:  None. FINDINGS: There is a fairly Bonilla left pleural effusion with left base atelectasis. There is a subtle area of ill-defined opacity in the right base. Lungs otherwise are clear. Heart is mildly enlarged with pulmonary vascularity normal. No adenopathy. No bone lesions. IMPRESSION: Bonilla left pleural effusion with left base atelectasis. Focal area of opacity right base concerning for developing pneumonia. Mild cardiomegaly. No adenopathy. Electronically Signed   By: Bretta Bang III M.D.   On: 12/22/2019 11:21   ECHOCARDIOGRAM COMPLETE  Result Date: 12/22/2019    ECHOCARDIOGRAM REPORT   Patient Name:   KEALOHILANI MAIORINO Date of Exam: 12/22/2019 Medical Rec #:  [id number]       Height:       61.0 in Accession #:    [id number]      Weight:       112.0 lb Date of Birth:  [birth year]       BSA:          1.476 m Patient Age:    85 years         BP:           153/61 mmHg Patient Gender: F               HR:           63 bpm. Exam Location:  Inpatient Procedure: 2D Echo, Cardiac Doppler and Color Doppler Indications:    Pleural effusion  History:        Patient has prior history of Echocardiogram examinations, most  recent 03/16/2010. Risk Factors:Hypertension, Diabetes and                 Dyslipidemia. CKD.  Sonographer:    Ross Ludwig RDCS (AE) Referring Phys: [phone] DAVID GIRGUIS IMPRESSIONS  1. Left ventricular ejection fraction, by estimation, is 60 to 65%. The left ventricle has normal function. The left ventricle has no regional wall motion abnormalities. There is severe concentric left ventricular hypertrophy. Left ventricular diastolic  parameters are consistent with Grade II diastolic dysfunction (pseudonormalization). Elevated left ventricular end-diastolic pressure.  2. Right ventricular systolic function is normal. The right ventricular size is normal. There is mildly elevated pulmonary artery systolic pressure. The estimated right ventricular systolic pressure is 37.8 mmHg.  3. Left atrial size was severely dilated.  4. A Bonilla pericardial effusion is present. The pericardial effusion is circumferential.  5. The mitral valve is degenerative. Mild mitral valve regurgitation. No evidence of mitral stenosis.  6. The aortic valve is tricuspid. Aortic valve regurgitation is trivial. Mild aortic valve sclerosis is present, with no evidence of aortic valve stenosis.  7. The inferior vena cava is dilated in size with >50% respiratory variability, suggesting right atrial pressure of 8 mmHg.  8. The LV myocardium is severly thickened with a speckeled pattern. In the setting of pericardial effusion and severe LA enlargement, would consider possible amyloidosis. Consider PYP scan or cardiac MRI if clinically indicated. FINDINGS  Left Ventricle: Left ventricular ejection fraction, by estimation, is 60 to 65%. The left ventricle has normal function.  The left ventricle has no regional wall motion abnormalities. The left ventricular internal cavity size was normal in size. There is  severe concentric left ventricular hypertrophy. Left ventricular diastolic parameters are consistent with Grade II diastolic dysfunction (pseudonormalization). Elevated left ventricular end-diastolic pressure. Right Ventricle: The right ventricular size is normal. No increase in right ventricular wall thickness. Right ventricular systolic function is normal. There is mildly elevated pulmonary artery systolic pressure. The tricuspid regurgitant velocity is 2.73  m/s, and with an assumed right atrial pressure of 8 mmHg, the estimated right ventricular systolic pressure is 37.8 mmHg. Left Atrium: Left atrial size was severely dilated. Right Atrium: Right atrial size was normal in size. Pericardium: A Bonilla pericardial effusion is present. The pericardial effusion is circumferential. Mitral Valve: The mitral valve is degenerative in appearance. There is mild thickening of the mitral valve leaflet(s). Mild mitral annular calcification. Mild mitral valve regurgitation. No evidence of mitral valve stenosis. MV peak gradient, 6.4 mmHg. The mean mitral valve gradient is 2.0 mmHg. Tricuspid Valve: The tricuspid valve is normal in structure. Tricuspid valve regurgitation is trivial. No evidence of tricuspid stenosis. Aortic Valve: The aortic valve is tricuspid. Aortic valve regurgitation is trivial. Mild aortic valve sclerosis is present, with no evidence of aortic valve stenosis. Aortic valve mean gradient measures 5.0 mmHg. Aortic valve peak gradient measures 8.4 mmHg. Aortic valve area, by VTI measures 1.85 cm. Pulmonic Valve: The pulmonic valve was normal in structure. Pulmonic valve regurgitation is not visualized. No evidence of pulmonic stenosis. Aorta: The aortic root is normal in size and structure. Venous: The inferior vena cava is dilated in size with greater than 50% respiratory  variability, suggesting right atrial pressure of 8 mmHg. IAS/Shunts: No atrial level shunt detected by color flow Doppler.  LEFT VENTRICLE PLAX 2D LVIDd:         4.20 cm  Diastology LVIDs:         2.20 cm  LV e' medial:  6.08 cm/s LV PW:         1.60 cm  LV E/e' medial:  17.4 LV IVS:        1.80 cm  LV e' lateral:   6.83 cm/s LVOT diam:     1.60 cm  LV E/e' lateral: 15.5 LV SV:         73 LV SV Index:   49 LVOT Area:     2.01 cm  RIGHT VENTRICLE            IVC RV Basal diam:  3.00 cm    IVC diam: 2.10 cm RV S prime:     9.30 cm/s TAPSE (M-mode): 2.3 cm LEFT ATRIUM              Index       RIGHT ATRIUM           Index LA diam:        3.60 cm  2.44 cm/m  RA Area:     15.10 cm LA Vol (A2C):   96.4 ml  65.30 ml/m RA Volume:   36.20 ml  24.52 ml/m LA Vol (A4C):   102.0 ml 69.09 ml/m LA Biplane Vol: 103.0 ml 69.77 ml/m  AORTIC VALVE AV Area (Vmax):    1.90 cm AV Area (Vmean):   1.63 cm AV Area (VTI):     1.85 cm AV Vmax:           145.00 cm/s AV Vmean:          107.000 cm/s AV VTI:            0.393 m AV Peak Grad:      8.4 mmHg AV Mean Grad:      5.0 mmHg LVOT Vmax:         137.00 cm/s LVOT Vmean:        86.600 cm/s LVOT VTI:          0.361 m LVOT/AV VTI ratio: 0.92  AORTA Ao Root diam: 2.80 cm Ao Asc diam:  2.00 cm MITRAL VALVE                TRICUSPID VALVE MV Area (PHT): 3.77 cm     TR Peak grad:   29.8 mmHg MV Peak grad:  6.4 mmHg     TR Vmax:        273.00 cm/s MV Mean grad:  2.0 mmHg MV Vmax:       1.26 m/s     SHUNTS MV Vmean:      60.6 cm/s    Systemic VTI:  0.36 m MV Decel Time: 201 msec     Systemic Diam: 1.60 cm MV E velocity: 106.00 cm/s MV A velocity: 53.60 cm/s MV E/A ratio:  1.98 Armanda Magic MD Electronically signed by Armanda Magic MD Signature Date/Time: 12/22/2019/1:37:09 PM    Final    US THORACENTESIS ASP PLEURAL SPACE W/IMG GUIDE  Result Date: 12/22/2019 INDICATION: Progressive renal failure with fluid overload and bilateral pleural effusions. Request for diagnostic and therapeutic  thoracentesis. EXAM: ULTRASOUND GUIDED LEFT THORACENTESIS MEDICATIONS: 1% lidocaine 10 mL COMPLICATIONS: None immediate. PROCEDURE: An ultrasound guided thoracentesis was thoroughly discussed with the patient and questions answered. The benefits, risks, alternatives and complications were also discussed. The patient understands and wishes to proceed with the procedure. Written consent was obtained. Ultrasound was performed to localize and mark an adequate pocket of fluid in the left chest. The area was then prepped and draped in the normal  sterile fashion. 1% Lidocaine was used for local anesthesia. Under ultrasound guidance a 6 Fr Safe-T-Centesis catheter was introduced. Thoracentesis was performed. The catheter was removed and a dressing applied. FINDINGS: A total of approximately 800 mL of clear yellow fluid was removed. Samples were sent to the laboratory as requested by the clinical team. IMPRESSION: Successful ultrasound guided left thoracentesis yielding 800 mL of pleural fluid. Read by: Corrin Parker, PA-C Electronically Signed   By: Irish Lack M.D.   On: 12/22/2019 12:38     Labs:   Basic Metabolic Panel: Recent Labs  Lab 12/22/19 0225 12/22/19 0225 12/23/19 2841 12/23/19 3244 12/24/19 0358 12/24/19 0358 12/25/19 0054 12/26/19 0115  NA 145  --  143  --  144  --  144 143  K 4.4   < > 4.7   < > 4.7   < > 4.9 4.9  CL 115*  --  115*  --  115*  --  116* 115*  CO2 21*  --  20*  --  17*  --  17* 19*  GLUCOSE 115*  --  102*  --  113*  --  117* 88  BUN 50*  --  53*  --  56*  --  60* 62*  CREATININE 5.48*  --  5.81*  --  6.07*  --  6.14* 6.59*  CALCIUM 8.1*  --  8.0*  --  8.3*  --  8.4* 8.3*  MG  --   --  1.6*  --  1.7  --  1.6* 1.7  PHOS 4.7*  --  4.6  --  5.5*  --  5.4* 5.2*   < > = values in this interval not displayed.   GFR Estimated Creatinine Clearance: 4.9 mL/min (A) (by C-G formula based on SCr of 6.59 mg/dL (H)). Liver Function Tests: Recent Labs  Lab 12/22/19 0225  12/22/19 1428  AST  --  15  ALT  --  11  ALKPHOS  --  34*  BILITOT  --  0.3  PROT  --  4.4*  ALBUMIN 2.1* 2.0*   No results for input(s): LIPASE, AMYLASE in the last 168 hours. No results for input(s): AMMONIA in the last 168 hours. Coagulation profile Recent Labs  Lab 12/24/19 0738  INR 1.1    CBC: Recent Labs  Lab 12/21/19 0930 12/21/19 0930 12/23/19 0632 12/23/19 2123 12/24/19 0358 12/25/19 0054 12/26/19 0115  WBC 5.8   < > 8.6 12.7* 10.0 9.1 8.9  NEUTROABS 4.2  --  6.6  --  8.0* 7.5 7.1  HGB 8.3*   < > 6.2* 8.7* 8.4* 8.2* 8.1*  HCT 26.6*   < > 19.5* 26.5* 26.0* 25.0* 25.1*  MCV 85.8   < > 85.5 84.7 85.8 85.9 87.5  PLT 278   < > 248 248 238 232 252   < > = values in this interval not displayed.   Cardiac Enzymes: No results for input(s): CKTOTAL, CKMB, CKMBINDEX, TROPONINI in the last 168 hours. BNP: Invalid input(s): POCBNP CBG: Recent Labs  Lab 12/24/19 0816 12/24/19 1730 12/24/19 2112 12/25/19 0723 12/25/19 1133  GLUCAP 89 131* 139* 100* 152*   D-Dimer No results for input(s): DDIMER in the last 72 hours. Hgb A1c No results for input(s): HGBA1C in the last 72 hours. Lipid Profile No results for input(s): CHOL, HDL, LDLCALC, TRIG, CHOLHDL, LDLDIRECT in the last 72 hours. Thyroid function studies No results for input(s): TSH, T4TOTAL, T3FREE, THYROIDAB in the last 72 hours.  Invalid  input(s): FREET3 Anemia work up No results for input(s): VITAMINB12, FOLATE, FERRITIN, TIBC, IRON, RETICCTPCT in the last 72 hours. Microbiology Recent Results (from the past 240 hour(s))  Respiratory Panel by RT PCR (Flu A&B, Covid) - Nasopharyngeal Swab     Status: None   Collection Time: 12/21/19  9:30 AM   Specimen: Nasopharyngeal Swab  Result Value Ref Range Status   SARS Coronavirus 2 by RT PCR NEGATIVE NEGATIVE Final    Comment: (NOTE) SARS-CoV-2 target nucleic acids are NOT DETECTED.  The SARS-CoV-2 RNA is generally detectable in upper  respiratoy specimens during the acute phase of infection. The lowest concentration of SARS-CoV-2 viral copies this assay can detect is 131 copies/mL. A negative result does not preclude SARS-Cov-2 infection and should not be used as the sole basis for treatment or other patient management decisions. A negative result may occur with  improper specimen collection/handling, submission of specimen other than nasopharyngeal swab, presence of viral mutation(s) within the areas targeted by this assay, and inadequate number of viral copies (<131 copies/mL). A negative result must be combined with clinical observations, patient history, and epidemiological information. The expected result is Negative.  Fact Sheet for Patients:  https://www.moore.com/  Fact Sheet for Healthcare Providers:  https://www.young.biz/  This test is no t yet approved or cleared by the [place] FDA and  has been authorized for detection and/or diagnosis of SARS-CoV-2 by FDA under an Emergency Use Authorization (EUA). This EUA will remain  in effect (meaning this test can be used) for the duration of the COVID-19 declaration under Section 564(b)(1) of the Act, 21 U.S.C. section 360bbb-3(b)(1), unless the authorization is terminated or revoked sooner.     Influenza A by PCR NEGATIVE NEGATIVE Final   Influenza B by PCR NEGATIVE NEGATIVE Final    Comment: (NOTE) The Xpert Xpress SARS-CoV-2/FLU/RSV assay is intended as an aid in  the diagnosis of influenza from Nasopharyngeal swab specimens and  should not be used as a sole basis for treatment. Nasal washings and  aspirates are unacceptable for Xpert Xpress SARS-CoV-2/FLU/RSV  testing.  Fact Sheet for Patients: https://www.moore.com/  Fact Sheet for Healthcare Providers: https://www.young.biz/  This test is not yet approved or cleared by the [place] FDA and  has been  authorized for detection and/or diagnosis of SARS-CoV-2 by  FDA under an Emergency Use Authorization (EUA). This EUA will remain  in effect (meaning this test can be used) for the duration of the  Covid-19 declaration under Section 564(b)(1) of the Act, 21  U.S.C. section 360bbb-3(b)(1), unless the authorization is  terminated or revoked. Performed at [facility], [address] [place], [place] [address]   MRSA PCR Screening     Status: None   Collection Time: 12/21/19  3:48 PM   Specimen: Nasal Mucosa; Nasopharyngeal  Result Value Ref Range Status   MRSA by PCR NEGATIVE NEGATIVE Final    Comment:        The GeneXpert MRSA Assay (FDA approved for NASAL specimens only), is one component of a comprehensive MRSA colonization surveillance program. It is not intended to diagnose MRSA infection nor to guide or monitor treatment for MRSA infections. Performed at [facility], [address], [place], [place] [address]   Urine Culture     Status: Abnormal   Collection Time: 12/21/19  8:37 PM   Specimen: Urine, Random  Result Value Ref Range Status   Specimen Description URINE, RANDOM  Final   Special Requests  Final    NONE Performed at [facility], [address], [place], [place] [address]    Culture 30,000 COLONIES/mL ESCHERICHIA COLI (A)  Final   Report Status 12/24/2019 FINAL  Final   Organism ID, Bacteria ESCHERICHIA COLI (A)  Final      Susceptibility   Escherichia coli - MIC*    AMPICILLIN <=2 SENSITIVE Sensitive     CEFAZOLIN <=4 SENSITIVE Sensitive     CEFEPIME <=0.12 SENSITIVE Sensitive     CEFTRIAXONE <=0.25 SENSITIVE Sensitive     CIPROFLOXACIN <=0.25 SENSITIVE Sensitive     GENTAMICIN <=1 SENSITIVE Sensitive     IMIPENEM <=0.25 SENSITIVE Sensitive     NITROFURANTOIN <=16 SENSITIVE Sensitive     TRIMETH/SULFA <=20 SENSITIVE Sensitive     AMPICILLIN/SULBACTAM <=2 SENSITIVE Sensitive     PIP/TAZO <=4 SENSITIVE Sensitive     * 30,000  COLONIES/mL ESCHERICHIA COLI  Culture, body fluid-bottle     Status: None (Preliminary result)   Collection Time: 12/22/19 12:16 PM   Specimen: Peritoneal Washings  Result Value Ref Range Status   Specimen Description PERITONEAL FLUID  Final   Special Requests NONE  Final   Culture   Final    NO GROWTH 3 DAYS Performed at [facility], [address], [place], [place] [address]    Report Status PENDING  Incomplete  Gram stain     Status: None   Collection Time: 12/22/19 12:16 PM   Specimen: Peritoneal Washings  Result Value Ref Range Status   Specimen Description PERITONEAL FLUID  Final   Special Requests NONE  Final   Gram Stain   Final    WBC PRESENT,BOTH PMN AND MONONUCLEAR NO ORGANISMS SEEN CYTOSPIN SMEAR Performed at [facility], [address] [place], [place] [address]    Report Status 12/22/2019 FINAL  Final     Signed: Melina Schools Deavion Strider  [facility] 12/26/2019, 8:48 AM

## 2019-12-26 NOTE — Progress Notes (Signed)
Patient being discharge home.  Patient is alert and oriented.  Vital signs stable.  Patient's belonging and equipment discharge home with patient.

## 2019-12-26 NOTE — Progress Notes (Signed)
[facility])  Chart and pt information reviewed by [facility] physician.  Hospice eligibility confirmed.  Hospital liaison spoke with patient's son [name] who confirms that DME is being delivered at this time. Per discussion the plan is to discharge home today.   [facility]ase send signed and completed DNR home with pt/family.  Please provide prescriptions at discharge as needed to ensure ongoing symptom management.    Thank you for the opportunity to participate in this pt's care.  Gillian Scarce, BSN, RN [employer] [phone] (24h on call)

## 2019-12-27 LAB — CULTURE, BODY FLUID W GRAM STAIN -BOTTLE: Culture: NO GROWTH

## 2020-04-07 DEATH — deceased

## 2022-05-20 IMAGING — US US RENAL
1 series · 14 of 25 positions shown · non-contrast
Comparison: None.

CLINICAL DATA: Acute kidney injury

EXAM:
RENAL / URINARY TRACT ULTRASOUND COMPLETE

[Series 1: us renal · 14 of 29 slices shown]
[im 1/29]
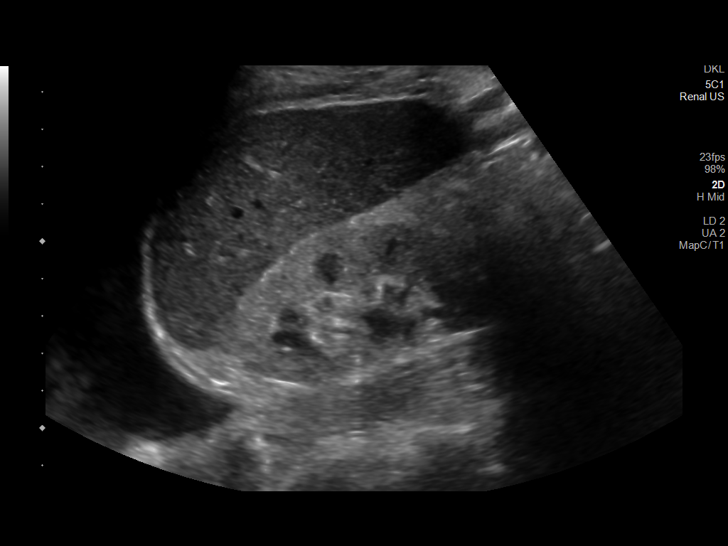
[im 3/29]
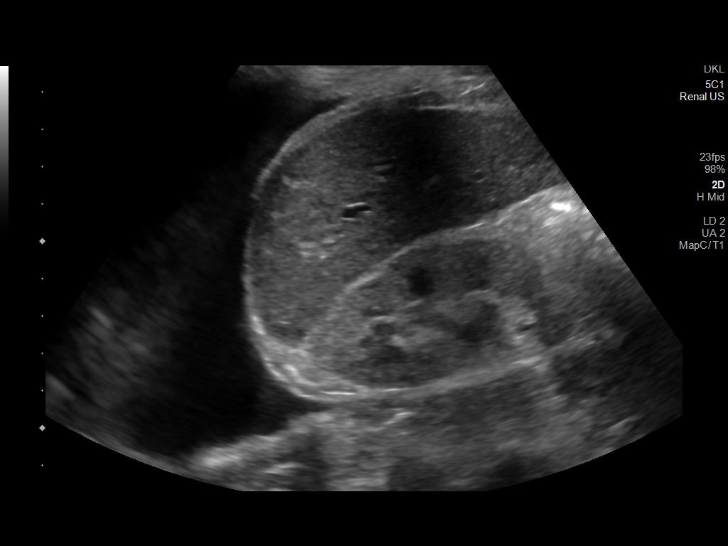
[im 5/29]
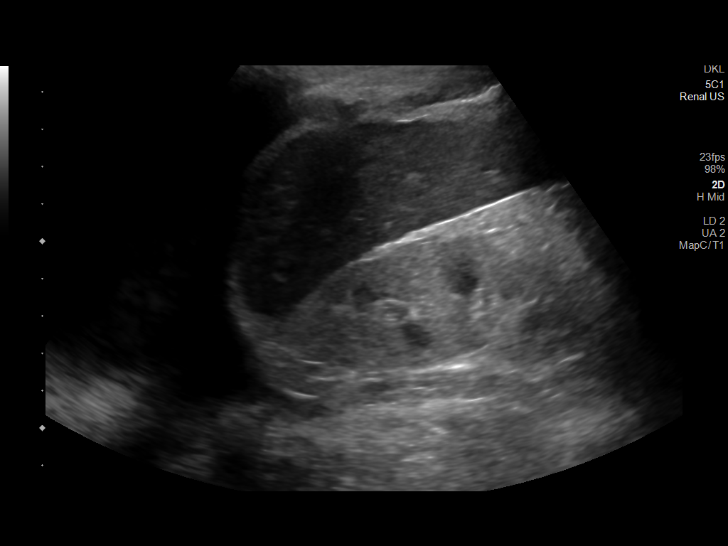
[im 8/29]
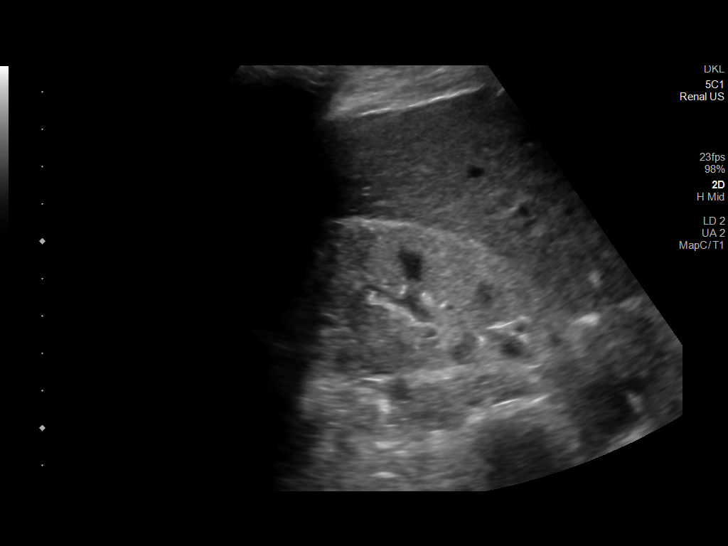
[im 10/29]
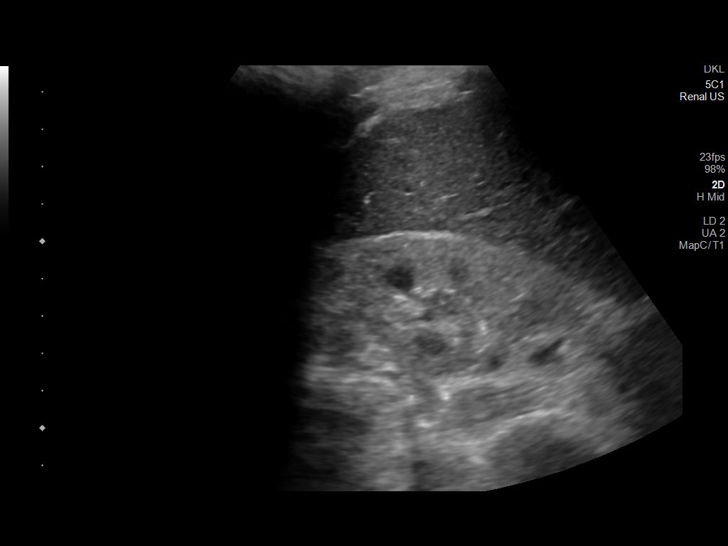
[im 11/29]
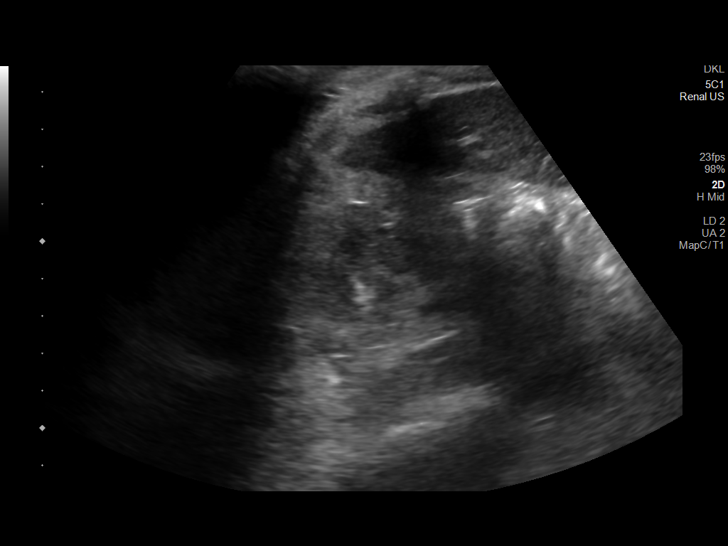
[im 13/29]
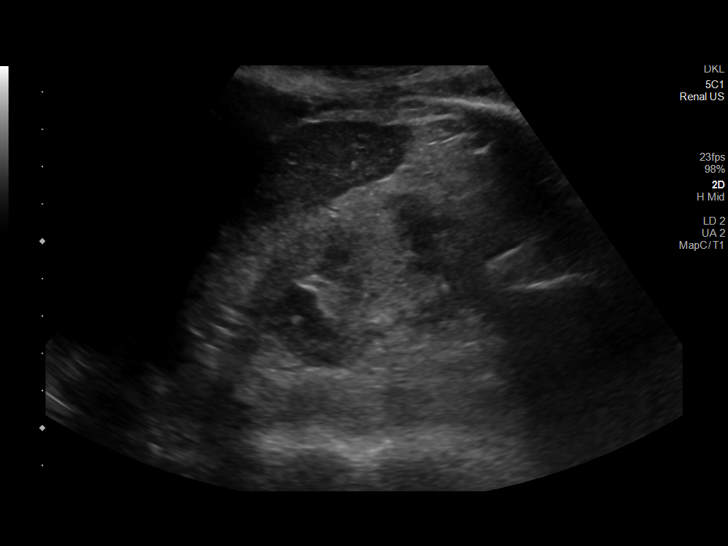
[im 16/29]
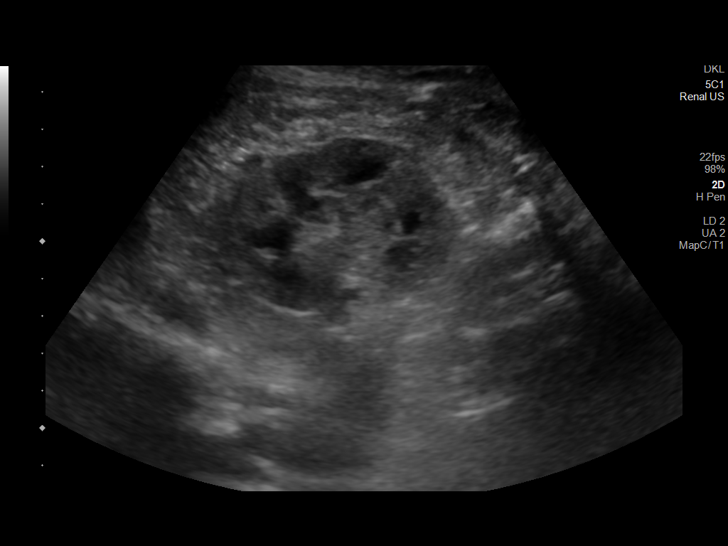
[im 18/29]
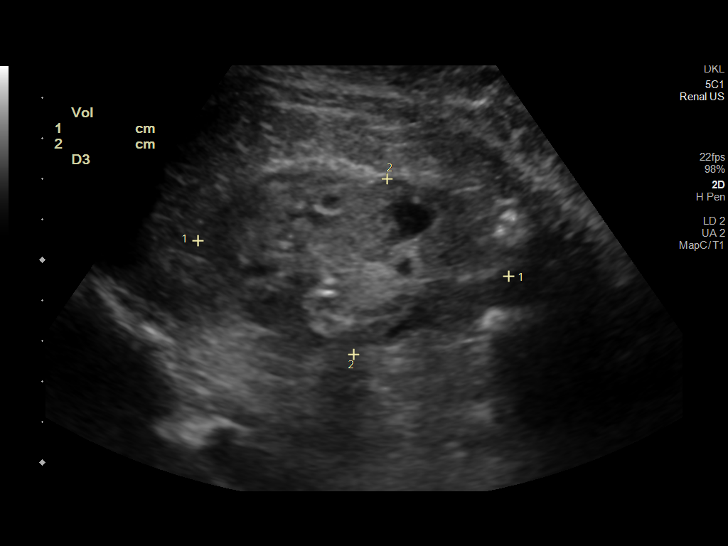
[im 19/29]
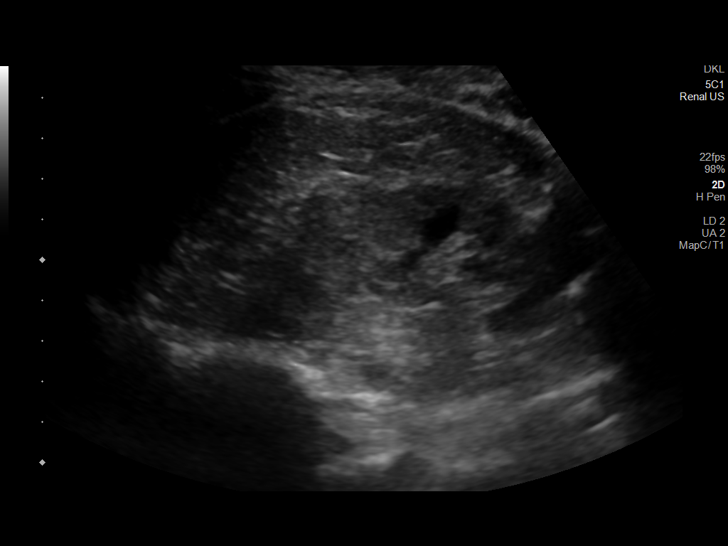
[im 22/29]
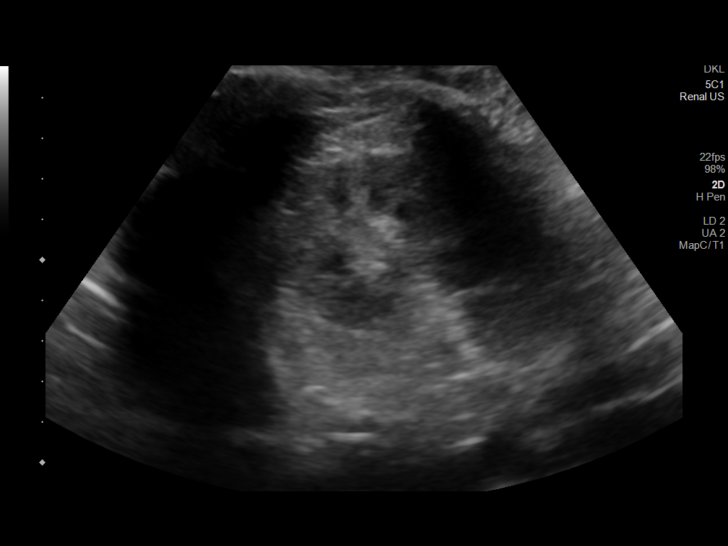
[im 24/29]
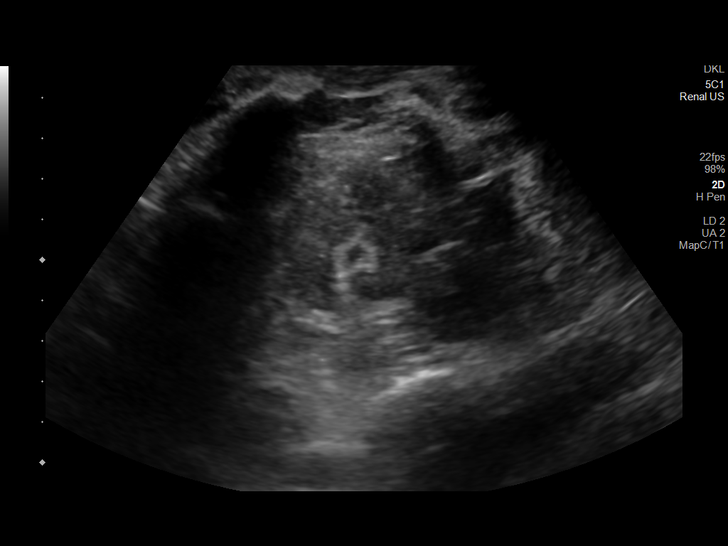
[im 26/29]
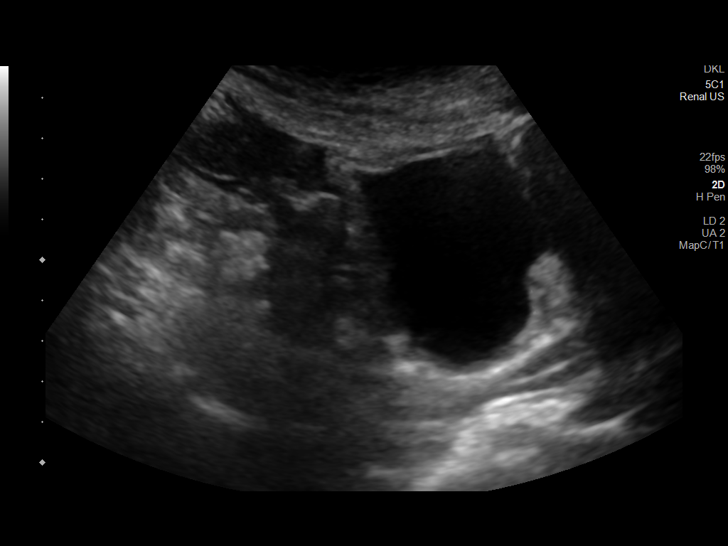
[im 29/29]
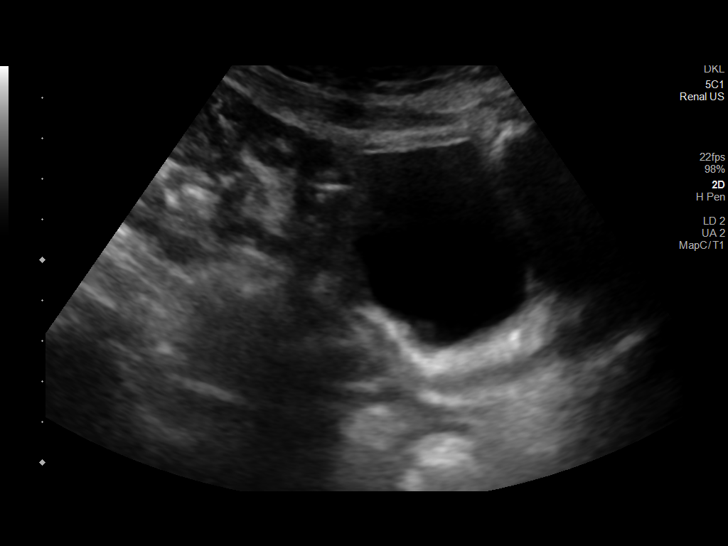

[14 of 25 positions shown; findings below may reference images not displayed]

FINDINGS: Right Kidney:

Renal measurements: 7.2 x 3.4 x 3.8 cm = volume: 48 mL. There is
increased cortical echogenicity. There appears to be mild collecting
system dilatation.

Left Kidney:

Renal measurements: 7.7 x 4.4 x 3.7 cm = volume: 66 mL. There is
increased cortical echogenicity with mild collecting system
dilatation.

Bladder:

The ureteral jets were not visualized.

Other:

Incidentally noted are bilateral pleural effusions that appear to be
at least moderate if not large in size.
IMPRESSION: 1. Small, echogenic kidneys bilaterally which can be seen in
patients with medical renal disease.
2. Mild bilateral collecting system dilatation.
3. The ureteral jets were not visualized.
4. Incidentally noted moderate if not large bilateral pleural
effusions.

## 2022-05-21 IMAGING — US US THORACENTESIS ASP PLEURAL SPACE W/IMG GUIDE
1 series · 3 of 3 positions shown · non-contrast
Comparison: none

INDICATION: Progressive renal failure with fluid overload and bilateral pleural
effusions. Request for diagnostic and therapeutic thoracentesis.

[Series 1: us thoracentesis asp pleural space w/img guide · 3 of 3 slices shown]
[im 1/3]
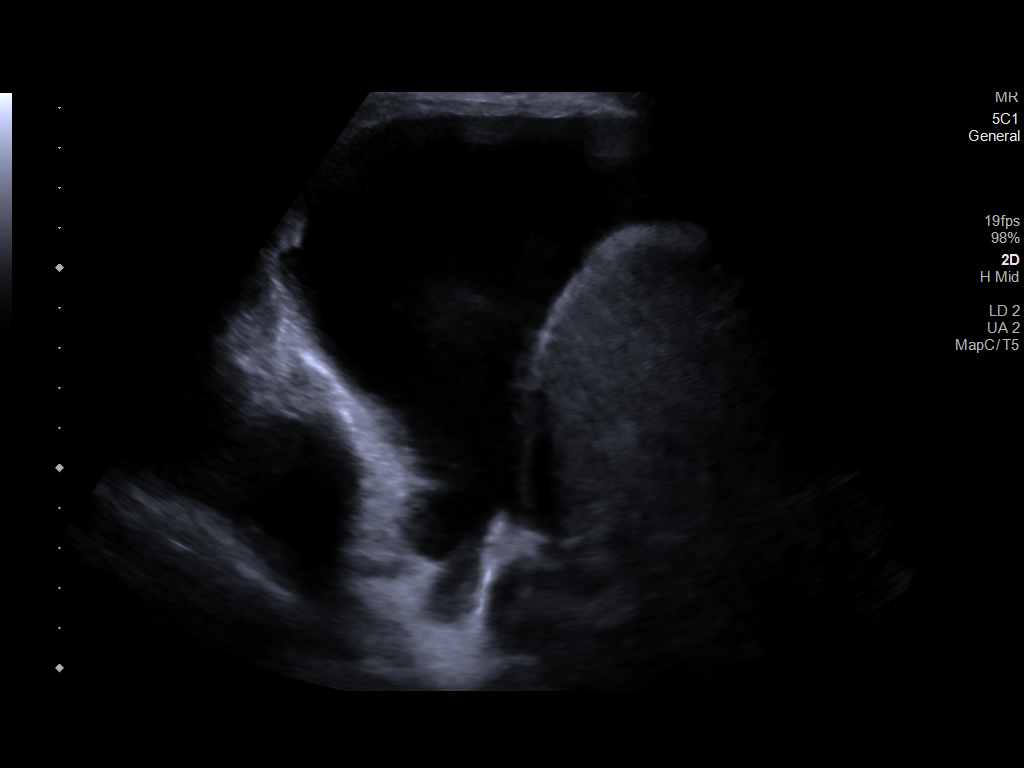
[im 2/3]
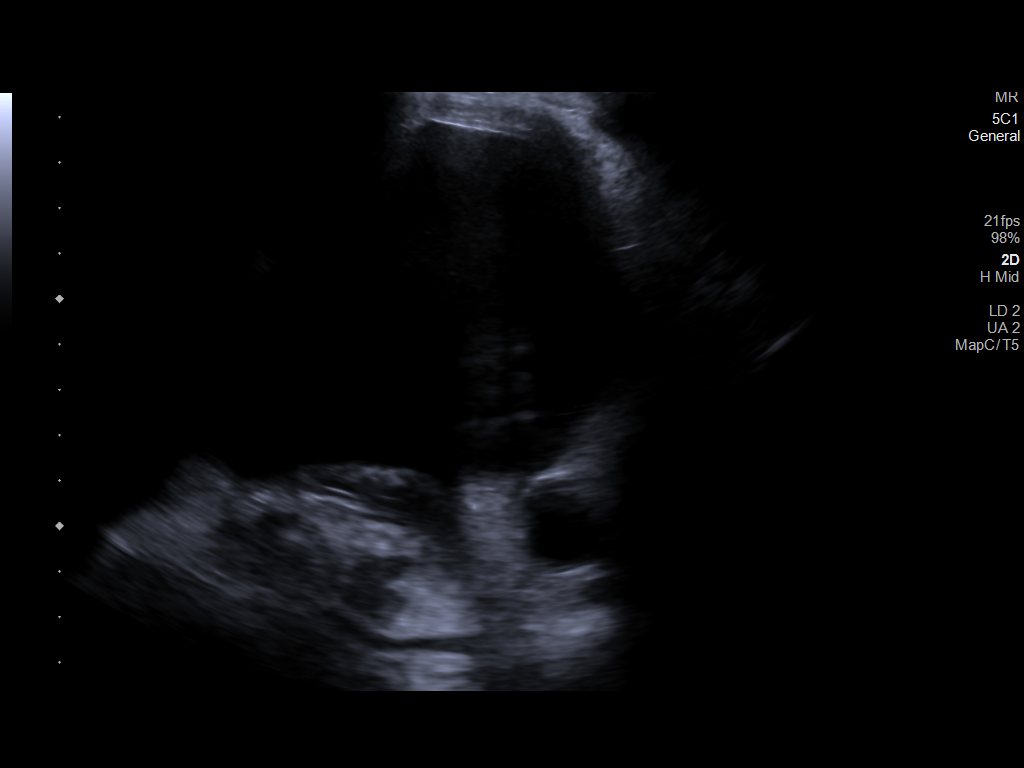
[im 3/3]
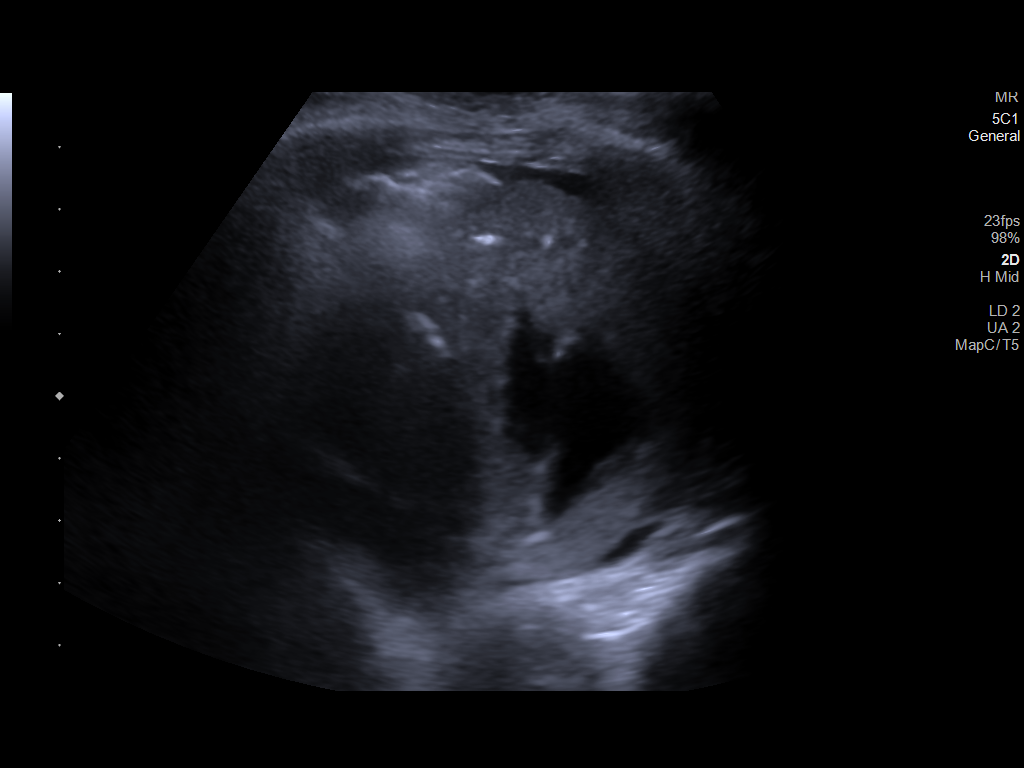

[3 of 3 positions shown; findings below may reference images not displayed]

EXAM:
ULTRASOUND GUIDED LEFT THORACENTESIS

MEDICATIONS:
1% lidocaine 10 mL

COMPLICATIONS:
None immediate.

PROCEDURE:
An ultrasound guided thoracentesis was thoroughly discussed with the
patient and questions answered. The benefits, risks, alternatives
and complications were also discussed. The patient understands and
wishes to proceed with the procedure. Written consent was obtained.

Ultrasound was performed to localize and mark an adequate pocket of
fluid in the left chest. The area was then prepped and draped in the
normal sterile fashion. 1% Lidocaine was used for local anesthesia.
Under ultrasound guidance a 6 Fr Safe-T-Centesis catheter was
introduced. Thoracentesis was performed. The catheter was removed
and a dressing applied.
FINDINGS: A total of approximately 800 mL of clear yellow fluid was removed.
Samples were sent to the laboratory as requested by the clinical
team.
IMPRESSION: Successful ultrasound guided left thoracentesis yielding 800 mL of
pleural fluid.
